# Patient Record
Sex: Female | Born: 2000 | Hispanic: No | Marital: Single
Health system: Western US, Academic
[De-identification: ages and names within clinical notes are randomized; demographics above are authoritative.]

## PROBLEM LIST (undated history)

## (undated) DIAGNOSIS — F419 Anxiety disorder, unspecified: Secondary | ICD-10-CM

## (undated) DIAGNOSIS — F329 Major depressive disorder, single episode, unspecified: Secondary | ICD-10-CM

## (undated) DIAGNOSIS — F32A Depression, unspecified: Secondary | ICD-10-CM

## (undated) DIAGNOSIS — N946 Dysmenorrhea, unspecified: Secondary | ICD-10-CM

## (undated) DIAGNOSIS — E876 Hypokalemia: Secondary | ICD-10-CM

## (undated) DIAGNOSIS — F909 Attention-deficit hyperactivity disorder, unspecified type: Secondary | ICD-10-CM

## (undated) DIAGNOSIS — N201 Calculus of ureter: Secondary | ICD-10-CM

## (undated) DIAGNOSIS — F309 Manic episode, unspecified: Secondary | ICD-10-CM

## (undated) DIAGNOSIS — N200 Calculus of kidney: Secondary | ICD-10-CM

## (undated) HISTORY — DX: Anxiety disorder, unspecified: F41.9

## (undated) HISTORY — DX: Depression, unspecified: F32.A

## (undated) HISTORY — DX: Calculus of ureter: N20.1

## (undated) HISTORY — DX: Dysmenorrhea, unspecified: N94.6

## (undated) HISTORY — DX: Hypokalemia: E87.6

## (undated) HISTORY — DX: Manic episode, unspecified: F30.9

## (undated) HISTORY — DX: Calculus of kidney: N20.0

---
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
## 2018-08-02 ENCOUNTER — Encounter (HOSPITAL_COMMUNITY): Payer: Self-pay | Admitting: *Deleted

## 2018-08-02 ENCOUNTER — Emergency Department (HOSPITAL_COMMUNITY)
Admission: EM | Admit: 2018-08-02 | Discharge: 2018-08-02 | Disposition: A | Discharge status: Home / Self Care | Attending: Emergency Medicine | Admitting: Emergency Medicine

## 2018-08-02 ENCOUNTER — Other Ambulatory Visit: Payer: Self-pay

## 2018-08-02 DIAGNOSIS — F909 Attention-deficit hyperactivity disorder, unspecified type: Secondary | ICD-10-CM | POA: Insufficient documentation

## 2018-08-02 DIAGNOSIS — Z79899 Other long term (current) drug therapy: Secondary | ICD-10-CM | POA: Diagnosis not present

## 2018-08-02 DIAGNOSIS — Z793 Long term (current) use of hormonal contraceptives: Secondary | ICD-10-CM | POA: Insufficient documentation

## 2018-08-02 DIAGNOSIS — N12 Tubulo-interstitial nephritis, not specified as acute or chronic: Secondary | ICD-10-CM

## 2018-08-02 DIAGNOSIS — N899 Noninflammatory disorder of vagina, unspecified: Secondary | ICD-10-CM | POA: Diagnosis not present

## 2018-08-02 DIAGNOSIS — N10 Acute tubulo-interstitial nephritis: Secondary | ICD-10-CM | POA: Insufficient documentation

## 2018-08-02 DIAGNOSIS — R1032 Left lower quadrant pain: Secondary | ICD-10-CM | POA: Diagnosis present

## 2018-08-02 DIAGNOSIS — R112 Nausea with vomiting, unspecified: Secondary | ICD-10-CM | POA: Insufficient documentation

## 2018-08-02 LAB — CBC WITH DIFFERENTIAL/PLATELET
Abs Immature Granulocytes: 0.02 10*3/uL (ref 0.00–0.07)
Basophils Absolute: 0 10*3/uL (ref 0.0–0.1)
Basophils Relative: 1 %
Eosinophils Absolute: 0.1 10*3/uL (ref 0.0–0.5)
Eosinophils Relative: 2 %
HCT: 41.4 % (ref 36.0–46.0)
Hemoglobin: 13.6 g/dL (ref 12.0–15.0)
Immature Granulocytes: 0 %
Lymphocytes Relative: 22 %
Lymphs Abs: 1.9 10*3/uL (ref 0.7–4.0)
MCH: 31.6 pg (ref 26.0–34.0)
MCHC: 32.9 g/dL (ref 30.0–36.0)
MCV: 96.1 fL (ref 80.0–100.0)
Monocytes Absolute: 0.6 10*3/uL (ref 0.1–1.0)
Monocytes Relative: 7 %
Neutro Abs: 5.7 10*3/uL (ref 1.7–7.7)
Neutrophils Relative %: 68 %
Platelets: 209 10*3/uL (ref 150–400)
RBC: 4.31 MIL/uL (ref 3.87–5.11)
RDW: 13.1 % (ref 11.5–15.5)
WBC: 8.4 10*3/uL (ref 4.0–10.5)
nRBC: 0 % (ref 0.0–0.2)

## 2018-08-02 LAB — URINALYSIS, ROUTINE W REFLEX MICROSCOPIC
Bilirubin Urine: NEGATIVE
Glucose, UA: NEGATIVE mg/dL
Ketones, ur: 5 mg/dL — AB
Nitrite: NEGATIVE
Protein, ur: 30 mg/dL — AB
Specific Gravity, Urine: 1.013 (ref 1.005–1.030)
WBC, UA: >50 WBC/hpf — ABNORMAL HIGH (ref 0–5)
pH: 6 (ref 5.0–8.0)

## 2018-08-02 LAB — BASIC METABOLIC PANEL
Anion gap: 10 (ref 5–15)
BUN: 21 mg/dL — ABNORMAL HIGH (ref 6–20)
CO2: 23 mmol/L (ref 22–32)
Calcium: 9.4 mg/dL (ref 8.9–10.3)
Chloride: 105 mmol/L (ref 98–111)
Creatinine, Ser: 0.86 mg/dL (ref 0.44–1.00)
GFR calc Af Amer: >60 mL/min (ref >60)
GFR calc non Af Amer: >60 mL/min (ref >60)
Glucose, Bld: 88 mg/dL (ref 70–99)
Potassium: 4.2 mmol/L (ref 3.5–5.1)
Sodium: 138 mmol/L (ref 135–145)

## 2018-08-02 LAB — WET PREP, GENITAL
Clue Cells Wet Prep HPF POC: NONE SEEN
Sperm: NONE SEEN
Trich, Wet Prep: NONE SEEN
Yeast Wet Prep HPF POC: NONE SEEN

## 2018-08-02 LAB — I-STAT BETA HCG BLOOD, ED (MC, WL, AP ONLY): I-stat hCG, quantitative: <5 m[IU]/mL (ref <5)

## 2018-08-02 MED ORDER — CEPHALEXIN 500 MG PO CAPS: 500.0000 mg | ORAL_CAPSULE | Freq: Four times a day (QID) | ORAL | 0 refills | Status: DC

## 2018-08-02 MED ORDER — SODIUM CHLORIDE 0.9 % IV SOLN
1.0000 g | Freq: Once | INTRAVENOUS | Status: AC
  Administered 2018-08-02: 1 g via INTRAVENOUS
  Filled 2018-08-02: qty 10

## 2018-08-02 NOTE — ED Triage Notes (Signed)
Pt in c/o L sided flank pain onset x 1 mth worse today, pt denies dysuria, pt denies n/v/d, pt denies vaginal bleeding, A&O x4

## 2018-08-02 NOTE — ED Notes (Signed)
Pelvic cart setup ready for pelvic

## 2018-08-02 NOTE — ED Provider Notes (Signed)
[facility]ERGENCY [facility]PART[facility] Provider Note   CSN: [id number] Arrival date & time: 08/02/18  [id number]     History   Chief Complaint Chief Complaint  Patient presents with  . Flank Pain    HPI Elaine Wong is a 18 y.o. female.     HPI  Patient is a 18 yo female with past medical history of ADHD, depression, anxiety, pyelonephritis presenting for left flank pain.  She reports that it is been intermittent for about a month but worsened over the past 24 hours.  It is associated with nausea, vomiting x1 today, nonbilious nonbloody.  Denies fever or chills.  She reports that she took an anti-inflammatory this morning which improved her symptoms.  She denies dysuria, urgency, frequency, or hematuria.  She does report some slight increase in vaginal discharge that is tinged with brown.  She is on oral contraceptives.  She reports she is in a monogamous sexual relationship with one female partner.  Denies concern about STI.  Denies chest pain or shortness of breath.  Past Medical History:  Diagnosis Date  . ADHD     There are no active problems to display for this patient.   History reviewed. No pertinent surgical history.   OB History   No obstetric history on file.      Home Medications    Prior to Admission medications   Medication Sig Start Date End Date Taking? Authorizing Provider  cephALEXin (KEFLEX) 500 MG capsule Take 1 capsule (500 mg total) by mouth 4 (four) times daily. 10/31/16   Muthersbaugh, Dahlia ClientHannah, PA-C  ibuprofen (ADVIL,MOTRIN) 600 MG tablet Take 1 tablet (600 mg total) by mouth every 6 (six) hours as needed. 05/20/17   Joy, Shawn C, PA-C  lidocaine (LIDODERM) 5 % Place 1 patch onto the skin daily. Remove & Discard patch within 12 hours or as directed by MD 05/20/17   Joy, Shawn C, PA-C  methocarbamol (ROBAXIN) 500 MG tablet Take 1 tablet (500 mg total) by mouth 2 (two) times daily. 05/20/17   Joy, Shawn C, PA-C  methylphenidate 54 MG PO CR tablet  Take 54 mg by mouth every morning.    [provider]    Family History No family history on file.  Social History Social History   Tobacco Use  . Smoking status: Never Smoker  . Smokeless tobacco: Never Used  Substance Use Topics  . Alcohol use: No  . Drug use: No     Allergies   Patient has no known allergies.   Review of Systems Review of Systems  Constitutional: Negative for chills and fever.  HENT: Negative for congestion and sore throat.   Eyes: Negative for visual disturbance.  Respiratory: Negative for cough, chest tightness and shortness of breath.   Cardiovascular: Negative for chest pain and leg swelling.  Gastrointestinal: Positive for nausea and vomiting. Negative for abdominal pain.  Genitourinary: Positive for flank pain. Negative for dysuria.  Musculoskeletal: Negative for back pain and myalgias.  Skin: Negative for rash.  Neurological: Negative for dizziness, syncope and light-headedness.     Physical Exam Updated Vital Signs BP (!) 109/59 (BP Location: Right Arm)   Pulse 87   Temp 98.5 F (36.9 C) (Oral)   Resp 18   Ht 5\' 6"  (1.676 m)   LMP 07/19/2018 (Approximate)   SpO2 100%   BMI 20.98 kg/m   Physical Exam Vitals signs and nursing note reviewed.  Constitutional:      General: She is not in  acute distress.    Appearance: She is well-developed.  HENT:     Head: Normocephalic and atraumatic.     Mouth/Throat:     Mouth: Mucous membranes are moist.  Eyes:     Conjunctiva/sclera: Conjunctivae normal.     Pupils: Pupils are equal, round, and reactive to light.  Neck:     Musculoskeletal: Normal range of motion and neck supple.  Cardiovascular:     Rate and Rhythm: Normal rate and regular rhythm.     Heart sounds: S1 normal and S2 normal. No murmur.  Pulmonary:     Effort: Pulmonary effort is normal.     Breath sounds: Normal breath sounds. No wheezing or rales.  Abdominal:     General: There is no distension.      Palpations: Abdomen is soft.     Tenderness: There is no abdominal tenderness. There is left CVA tenderness. There is no guarding.  Genitourinary:    Comments: Pelvic examination performed with nurse tech chaperone present.  No lymphadenopathy of the inguinal region.  Vaginal tissue pink and rugated.  Small amount of milky white discharge in vaginal vault.  Cervix nonerythematous and nonfriable.  No CMT nor adnexal tenderness. Musculoskeletal: Normal range of motion.        General: No deformity.  Lymphadenopathy:     Cervical: No cervical adenopathy.  Skin:    General: Skin is warm and dry.     Findings: No erythema or rash.  Neurological:     Mental Status: She is alert.     Comments: Cranial nerves grossly intact. Patient moves extremities symmetrically and with good coordination.  Psychiatric:        Behavior: Behavior normal.        Thought Content: Thought content normal.        Judgment: Judgment normal.      ED Treatments / Results  Labs (all labs ordered are listed, but only abnormal results are displayed) Labs Reviewed  WET PREP, GENITAL - Abnormal; Notable for the following components:      Result Value   WBC, Wet Prep HPF POC MANY (*)    All other components within normal limits  URINALYSIS, ROUTINE W REFLEX MICROSCOPIC - Abnormal; Notable for the following components:   APPearance HAZY (*)    Hgb urine dipstick MODERATE (*)    Ketones, ur 5 (*)    Protein, ur 30 (*)    Leukocytes,Ua LARGE (*)    WBC, UA >50 (*)    Bacteria, UA FEW (*)    All other components within normal limits  BASIC METABOLIC PANEL - Abnormal; Notable for the following components:   BUN 21 (*)    All other components within normal limits  URINE CULTURE  CBC WITH DIFFERENTIAL/PLATELET  HIV ANTIBODY (ROUTINE TESTING W REFLEX)  RPR  I-STAT BETA HCG BLOOD, ED ([facility], [facility], [facility] ONLY)  GC/CHLAMYDIA PROBE AMP ([place]) NOT AT [facility]    EKG None  Radiology No results found.  Procedures  Procedures (including critical care time)  Medications Ordered in ED Medications - No data to display   Initial Impression / Assessment and Plan / ED Course  I have reviewed the triage vital signs and the nursing notes.  Pertinent labs & imaging results that were available during my care of the patient were reviewed by me and considered in my medical decision making (see chart for details).  Clinical Course as of Aug 01 1201  Thu Aug 02, 2018  1132  Suggestive of pyelo.  Urinalysis, Routine w reflex microscopic- may I&O cath if menses(!) [AM]  1133 Consistent with pt's report of vaginal spotting. Presentation not consistent with nephrolithiasis.  Hgb urine dipstickMarland Kitchen): MODERATE [AM]  1203 Clinically, not suggestive of PID on exam.   WBC, Wet Prep HPF POC(!): MANY [AM]    Clinical Course User Index [AM] Albesa Seen, PA-C       This is a well-appearing 18 year old female with past medical history of ADHD, depression, anxiety, pyelonephritis presenting for left flank pain.  Intermittent x1 month but reports worsening over the past 24 hours.  She does have systemic symptoms of nausea and vomiting.  No fever or chills.  Urinalysis suggestive of infection.  She has minimal microscopic WBCs, and history not consistent with nephrolithiasis. Not suggestive of PE. Clinically, more suggestive of pyelonephritis which patient has a history of.  No leukocytosis.  Normal renal function.  Patient given Rocephin x1 here in the emergency department and will prescribe Keflex.  She is given return precautions for any worsening pain, tractable nausea or vomiting or fevers.  Patient is in understanding and agrees with plan of care.  Final Clinical Impressions(s) / ED Diagnoses   Final diagnoses:  Pyelonephritis    ED Discharge Orders         Ordered    cephALEXin (KEFLEX) 500 MG capsule  4 times daily     08/02/18 [address] 08/02/18 1216    Malvin Johns, MD  08/02/18 1244

## 2018-08-02 NOTE — Discharge Instructions (Signed)
Please see the information and instructions below regarding your visit.  Your diagnoses today include:  1. Pyelonephritis    Your symptoms are caused by a urinary tract infection, which occurs when bacteria travel up into the bladder. From there, it can ascend to the kidney. This is a very common condition. Often, bacteria is transmitted while wiping after using the restroom. It is reassuring that you do not have a fever today.  Tests performed today include: See side panel of your discharge paperwork for testing performed today. Vital signs are listed at the bottom of these instructions.  -Urine tests. Suggestive of infection.   Your wet prep does not demonstrate any bacterial vaginosis or yeast infection.  Medications prescribed:    Take any prescribed medications only as prescribed, and any over the counter medications only as directed on the packaging.  Please take all of your antibiotics until finished.   You may develop abdominal discomfort or nausea from the antibiotic. If this occurs, you may take it with food. Some patients also get diarrhea with antibiotics. You may help offset this with probiotics which you can buy or get in yogurt. Do not eat or take the probiotics until 2 hours after your antibiotic. Some women develop vaginal yeast infections after antibiotics. If you develop unusual vaginal discharge after being on this medication, please see your primary care provider.   Some people develop allergies to antibiotics. Symptoms of antibiotic allergy can be mild and include a flat rash and itching. They can also be more serious and include:  ?Hives - Hives are raised, red patches of skin that are usually very itchy.  ?Lip or tongue swelling  ?Trouble swallowing or breathing  ?Blistering of the skin or mouth.  If you have any of these serious symptoms, please seek emergency medical care immediately.   Home care instructions:  Please follow any educational materials  contained in this packet.  Please stay hydrated by making sure that your urine is very light yellow in color.    Follow-up instructions: Please follow-up with your primary care provider in one week for further evaluation of your symptoms.   Return instructions:  Please return to the Emergency Department if you experience worsening symptoms. Please seek immediate care if you develop the following: Your symptoms are no better or worse in 3 days. There is worsening back pain or lower abdominal pain.  You develop chills.  You have a fever.  There is nausea or vomiting.  There is continued burning or discomfort with urination.  You have any additional concerns.  Please return if you have any other emergent concerns.  Additional Information:   Your vital signs today were: BP 112/82 (BP Location: Left Arm)    Pulse 96    Temp 98.5 F (36.9 C) (Oral)    Resp 16    Ht 5\' 6"  (1.676 m)    LMP 07/19/2018 (Approximate)    SpO2 99%    BMI 20.98 kg/m  If your blood pressure (BP) was elevated on multiple readings during this visit above 130 for the top number or above 80 for the bottom number, please have this repeated by your primary care provider within one month. --------------  Thank you for allowing [name] to participate in your care today.

## 2018-08-03 LAB — HIV ANTIBODY (ROUTINE TESTING W REFLEX): HIV Screen 4th Generation wRfx: NONREACTIVE

## 2018-08-03 LAB — GC/CHLAMYDIA PROBE AMP (~~LOC~~) NOT AT ARMC
Chlamydia: NEGATIVE
Neisseria Gonorrhea: NEGATIVE

## 2018-08-04 LAB — URINE CULTURE: Culture: 90000 — AB

## 2018-08-05 ENCOUNTER — Telehealth: Payer: Self-pay | Admitting: Emergency Medicine

## 2018-08-05 NOTE — Telephone Encounter (Signed)
Post ED Visit - Positive Culture Follow-up  Culture report reviewed by antimicrobial stewardship pharmacist: [place] Team []  Elenor Quinones, Pharm.D. []  Heide Guile, Pharm.D., BCPS AQ-ID []  Parks Neptune, Pharm.D., BCPS []  Alycia Rossetti, Pharm.D., BCPS []  [place], Pharm.D., BCPS, AAHIVP []  Legrand Como, Pharm.D., BCPS, AAHIVP []  Salome Arnt, PharmD, BCPS []  Johnnette Gourd, PharmD, BCPS [x]  Hughes Better, PharmD, BCPS []  Leeroy Cha, PharmD []  Laqueta Linden, PharmD, BCPS []  Albertina Parr, PharmD  [place] Team []  Leodis Sias, PharmD []  Lindell Spar, PharmD []  Royetta Asal, PharmD []  Graylin Shiver, Rph []  Rema Fendt) Glennon Mac, PharmD []  Arlyn Dunning, PharmD []  Netta Cedars, PharmD []  Dia Sitter, PharmD []  Leone Haven, PharmD []  Gretta Arab, PharmD []  Theodis Shove, PharmD []  Peggyann Juba, PharmD []  Reuel Boom, PharmD   Positive urine culture Treated with Cephalexin, organism sensitive to the same and no further patient follow-up is required at this time.  Sandi Raveling Elaine Wong 08/05/2018, 1:56 PM

## 2018-08-18 ENCOUNTER — Emergency Department (HOSPITAL_COMMUNITY): Admitting: Anesthesiology

## 2018-08-18 ENCOUNTER — Emergency Department (HOSPITAL_COMMUNITY)

## 2018-08-18 ENCOUNTER — Other Ambulatory Visit: Payer: Self-pay

## 2018-08-18 ENCOUNTER — Inpatient Hospital Stay (HOSPITAL_COMMUNITY)
Admission: EM | Admit: 2018-08-18 | Discharge: 2018-08-20 | DRG: 661 | Disposition: A | Discharge status: Home / Self Care | Attending: Urology | Admitting: Urology

## 2018-08-18 ENCOUNTER — Encounter (HOSPITAL_COMMUNITY): Payer: Self-pay | Admitting: *Deleted

## 2018-08-18 ENCOUNTER — Encounter (HOSPITAL_COMMUNITY)
Admission: EM | Disposition: A | Discharge status: Home / Self Care | Payer: Self-pay | Source: Home / Self Care | Attending: Urology

## 2018-08-18 DIAGNOSIS — N136 Pyonephrosis: Principal | ICD-10-CM | POA: Diagnosis present

## 2018-08-18 DIAGNOSIS — B9620 Unspecified Escherichia coli [E. coli] as the cause of diseases classified elsewhere: Secondary | ICD-10-CM | POA: Diagnosis present

## 2018-08-18 DIAGNOSIS — N179 Acute kidney failure, unspecified: Secondary | ICD-10-CM | POA: Diagnosis present

## 2018-08-18 DIAGNOSIS — D72829 Elevated white blood cell count, unspecified: Secondary | ICD-10-CM

## 2018-08-18 DIAGNOSIS — N200 Calculus of kidney: Secondary | ICD-10-CM

## 2018-08-18 DIAGNOSIS — Z20828 Contact with and (suspected) exposure to other viral communicable diseases: Secondary | ICD-10-CM | POA: Diagnosis present

## 2018-08-18 DIAGNOSIS — E876 Hypokalemia: Secondary | ICD-10-CM | POA: Diagnosis present

## 2018-08-18 DIAGNOSIS — F909 Attention-deficit hyperactivity disorder, unspecified type: Secondary | ICD-10-CM | POA: Diagnosis present

## 2018-08-18 DIAGNOSIS — R739 Hyperglycemia, unspecified: Secondary | ICD-10-CM

## 2018-08-18 DIAGNOSIS — N132 Hydronephrosis with renal and ureteral calculous obstruction: Secondary | ICD-10-CM | POA: Diagnosis present

## 2018-08-18 DIAGNOSIS — N201 Calculus of ureter: Secondary | ICD-10-CM

## 2018-08-18 DIAGNOSIS — N12 Tubulo-interstitial nephritis, not specified as acute or chronic: Secondary | ICD-10-CM

## 2018-08-18 HISTORY — PX: HOLMIUM LASER APPLICATION: SHX5852

## 2018-08-18 HISTORY — PX: CYSTOSCOPY WITH RETROGRADE PYELOGRAM, URETEROSCOPY AND STENT PLACEMENT: SHX5789

## 2018-08-18 LAB — URINALYSIS, ROUTINE W REFLEX MICROSCOPIC
Bilirubin Urine: NEGATIVE
Glucose, UA: NEGATIVE mg/dL
Ketones, ur: NEGATIVE mg/dL
Nitrite: NEGATIVE
Protein, ur: 30 mg/dL — AB
Specific Gravity, Urine: 1.02 (ref 1.005–1.030)
WBC, UA: >50 WBC/hpf — ABNORMAL HIGH (ref 0–5)
pH: 7 (ref 5.0–8.0)

## 2018-08-18 LAB — COMPREHENSIVE METABOLIC PANEL
ALT: 17 U/L (ref 0–44)
AST: 24 U/L (ref 15–41)
Albumin: 4.4 g/dL (ref 3.5–5.0)
Alkaline Phosphatase: 35 U/L — ABNORMAL LOW (ref 38–126)
Anion gap: 13 (ref 5–15)
BUN: 26 mg/dL — ABNORMAL HIGH (ref 6–20)
CO2: 20 mmol/L — ABNORMAL LOW (ref 22–32)
Calcium: 10.1 mg/dL (ref 8.9–10.3)
Chloride: 105 mmol/L (ref 98–111)
Creatinine, Ser: 1.19 mg/dL — ABNORMAL HIGH (ref 0.44–1.00)
GFR calc Af Amer: >60 mL/min (ref >60)
GFR calc non Af Amer: >60 mL/min (ref >60)
Glucose, Bld: 136 mg/dL — ABNORMAL HIGH (ref 70–99)
Potassium: 3 mmol/L — ABNORMAL LOW (ref 3.5–5.1)
Sodium: 138 mmol/L (ref 135–145)
Total Bilirubin: 0.7 mg/dL (ref 0.3–1.2)
Total Protein: 7.5 g/dL (ref 6.5–8.1)

## 2018-08-18 LAB — CBC
HCT: 42.8 % (ref 36.0–46.0)
Hemoglobin: 14.8 g/dL (ref 12.0–15.0)
MCH: 31.7 pg (ref 26.0–34.0)
MCHC: 34.6 g/dL (ref 30.0–36.0)
MCV: 91.6 fL (ref 80.0–100.0)
Platelets: 317 10*3/uL (ref 150–400)
RBC: 4.67 MIL/uL (ref 3.87–5.11)
RDW: 12.7 % (ref 11.5–15.5)
WBC: 17.5 10*3/uL — ABNORMAL HIGH (ref 4.0–10.5)
nRBC: 0 % (ref 0.0–0.2)

## 2018-08-18 LAB — SARS CORONAVIRUS 2 BY RT PCR (HOSPITAL ORDER, PERFORMED IN ~~LOC~~ HOSPITAL LAB): SARS Coronavirus 2: NEGATIVE

## 2018-08-18 LAB — I-STAT BETA HCG BLOOD, ED (MC, WL, AP ONLY): I-stat hCG, quantitative: <5 m[IU]/mL (ref <5)

## 2018-08-18 LAB — PREGNANCY, URINE: Preg Test, Ur: NEGATIVE

## 2018-08-18 SURGERY — CYSTOURETEROSCOPY, WITH RETROGRADE PYELOGRAM AND STENT INSERTION
Anesthesia: General | Site: Ureter | Laterality: Left

## 2018-08-18 MED ORDER — IOHEXOL 300 MG/ML  SOLN
80.0000 mL | Freq: Once | INTRAMUSCULAR | Status: AC | PRN
  Administered 2018-08-18: 80 mL via INTRAVENOUS

## 2018-08-18 MED ORDER — KETOROLAC TROMETHAMINE 30 MG/ML IJ SOLN
30.0000 mg | Freq: Once | INTRAMUSCULAR | Status: AC
  Administered 2018-08-18: 30 mg via INTRAVENOUS
  Filled 2018-08-18: qty 1

## 2018-08-18 MED ORDER — PHENYLEPHRINE 40 MCG/ML (10ML) SYRINGE FOR IV PUSH (FOR BLOOD PRESSURE SUPPORT)
PREFILLED_SYRINGE | INTRAVENOUS | Status: DC | PRN
  Administered 2018-08-18 (×2): 120 ug via INTRAVENOUS
  Administered 2018-08-18 (×3): 80 ug via INTRAVENOUS

## 2018-08-18 MED ORDER — FENTANYL CITRATE (PF) 100 MCG/2ML IJ SOLN
INTRAMUSCULAR | Status: DC | PRN
  Administered 2018-08-18 (×2): 50 ug via INTRAVENOUS

## 2018-08-18 MED ORDER — ROCURONIUM BROMIDE 10 MG/ML (PF) SYRINGE
PREFILLED_SYRINGE | INTRAVENOUS | Status: AC
  Filled 2018-08-18: qty 10

## 2018-08-18 MED ORDER — PHENYLEPHRINE 40 MCG/ML (10ML) SYRINGE FOR IV PUSH (FOR BLOOD PRESSURE SUPPORT)
PREFILLED_SYRINGE | INTRAVENOUS | Status: AC
  Filled 2018-08-18: qty 10

## 2018-08-18 MED ORDER — PROPOFOL 10 MG/ML IV BOLUS
INTRAVENOUS | Status: AC
  Filled 2018-08-18: qty 40

## 2018-08-18 MED ORDER — LIDOCAINE 2% (20 MG/ML) 5 ML SYRINGE
INTRAMUSCULAR | Status: AC
  Filled 2018-08-18: qty 5

## 2018-08-18 MED ORDER — SODIUM CHLORIDE 0.9 % IV BOLUS
1000.0000 mL | Freq: Once | INTRAVENOUS | Status: AC
  Administered 2018-08-18: 12:00:00 1000 mL via INTRAVENOUS

## 2018-08-18 MED ORDER — PROPOFOL 10 MG/ML IV BOLUS
INTRAVENOUS | Status: DC | PRN
  Administered 2018-08-18: 160 mg via INTRAVENOUS

## 2018-08-18 MED ORDER — FENTANYL CITRATE (PF) 100 MCG/2ML IJ SOLN
INTRAMUSCULAR | Status: AC
  Filled 2018-08-18: qty 2

## 2018-08-18 MED ORDER — MIDAZOLAM HCL 2 MG/2ML IJ SOLN
INTRAMUSCULAR | Status: AC
  Filled 2018-08-18: qty 2

## 2018-08-18 MED ORDER — SENNOSIDES-DOCUSATE SODIUM 8.6-50 MG PO TABS
1.0000 | ORAL_TABLET | Freq: Two times a day (BID) | ORAL | Status: DC
  Administered 2018-08-18 – 2018-08-20 (×4): 1 via ORAL
  Filled 2018-08-18 (×4): qty 1

## 2018-08-18 MED ORDER — FENTANYL CITRATE (PF) 100 MCG/2ML IJ SOLN
50.0000 ug | INTRAMUSCULAR | Status: AC | PRN
  Administered 2018-08-18 (×2): 50 ug via INTRAVENOUS
  Filled 2018-08-18 (×2): qty 2

## 2018-08-18 MED ORDER — NORETHIN ACE-ETH ESTRAD-FE 1-20 MG-MCG PO TABS: 1.0000 | ORAL_TABLET | Freq: Every day | ORAL | Status: DC

## 2018-08-18 MED ORDER — OXYCODONE HCL 5 MG PO TABS: 5.0000 mg | ORAL_TABLET | ORAL | Status: DC | PRN

## 2018-08-18 MED ORDER — MIDAZOLAM HCL 5 MG/5ML IJ SOLN
INTRAMUSCULAR | Status: DC | PRN
  Administered 2018-08-18: 2 mg via INTRAVENOUS

## 2018-08-18 MED ORDER — ACETAMINOPHEN 500 MG PO TABS
1000.0000 mg | ORAL_TABLET | Freq: Three times a day (TID) | ORAL | Status: AC
  Administered 2018-08-18 – 2018-08-19 (×3): 1000 mg via ORAL
  Filled 2018-08-18 (×4): qty 2

## 2018-08-18 MED ORDER — HYDROMORPHONE HCL 1 MG/ML IJ SOLN: 0.5000 mg | INTRAMUSCULAR | Status: DC | PRN

## 2018-08-18 MED ORDER — LACTATED RINGERS IV SOLN
INTRAVENOUS | Status: DC | PRN
  Administered 2018-08-18: 17:00:00 via INTRAVENOUS

## 2018-08-18 MED ORDER — MEPERIDINE HCL 50 MG/ML IJ SOLN: 6.2500 mg | INTRAMUSCULAR | Status: DC | PRN

## 2018-08-18 MED ORDER — LIDOCAINE 2% (20 MG/ML) 5 ML SYRINGE
INTRAMUSCULAR | Status: DC | PRN
  Administered 2018-08-18: 60 mg via INTRAVENOUS

## 2018-08-18 MED ORDER — METOCLOPRAMIDE HCL 5 MG/ML IJ SOLN: 10.0000 mg | Freq: Once | INTRAMUSCULAR | Status: DC | PRN

## 2018-08-18 MED ORDER — SODIUM CHLORIDE 0.9 % IV SOLN
1.0000 g | Freq: Once | INTRAVENOUS | Status: AC
  Administered 2018-08-18: 1 g via INTRAVENOUS
  Filled 2018-08-18: qty 10

## 2018-08-18 MED ORDER — SODIUM CHLORIDE 0.9 % IV SOLN
1.0000 g | INTRAVENOUS | Status: DC
  Administered 2018-08-19 – 2018-08-20 (×2): 1 g via INTRAVENOUS
  Filled 2018-08-18: qty 10
  Filled 2018-08-18: qty 1

## 2018-08-18 MED ORDER — DEXAMETHASONE SODIUM PHOSPHATE 10 MG/ML IJ SOLN
INTRAMUSCULAR | Status: DC | PRN
  Administered 2018-08-18: 5 mg via INTRAVENOUS

## 2018-08-18 MED ORDER — SODIUM CHLORIDE 0.9% FLUSH: 3.0000 mL | Freq: Once | INTRAVENOUS | Status: DC

## 2018-08-18 MED ORDER — ONDANSETRON HCL 4 MG/2ML IJ SOLN
4.0000 mg | Freq: Once | INTRAMUSCULAR | Status: AC
  Administered 2018-08-18: 4 mg via INTRAVENOUS
  Filled 2018-08-18: qty 2

## 2018-08-18 MED ORDER — FENTANYL CITRATE (PF) 100 MCG/2ML IJ SOLN: 25.0000 ug | INTRAMUSCULAR | Status: DC | PRN

## 2018-08-18 MED ORDER — ONDANSETRON HCL 4 MG/2ML IJ SOLN
INTRAMUSCULAR | Status: DC | PRN
  Administered 2018-08-18: 4 mg via INTRAVENOUS

## 2018-08-18 MED ORDER — SUCCINYLCHOLINE CHLORIDE 200 MG/10ML IV SOSY
PREFILLED_SYRINGE | INTRAVENOUS | Status: AC
  Filled 2018-08-18: qty 10

## 2018-08-18 MED ORDER — SODIUM CHLORIDE 0.9 % IR SOLN
Status: DC | PRN
  Administered 2018-08-18: 3000 mL

## 2018-08-18 MED ORDER — LACTATED RINGERS IV SOLN: INTRAVENOUS | Status: DC

## 2018-08-18 MED ORDER — SERTRALINE HCL 25 MG PO TABS
25.0000 mg | ORAL_TABLET | Freq: Every day | ORAL | Status: DC
  Administered 2018-08-18 – 2018-08-20 (×3): 25 mg via ORAL
  Filled 2018-08-18 (×3): qty 1

## 2018-08-18 MED ORDER — IOHEXOL 300 MG/ML  SOLN
INTRAMUSCULAR | Status: DC | PRN
  Administered 2018-08-18: 10 mL

## 2018-08-18 SURGICAL SUPPLY — 23 items
BAG URO CATCHER STRL LF (MISCELLANEOUS) ×2 IMPLANT
BASKET LASER NITINOL 1.9FR (BASKET) ×2 IMPLANT
CATH INTERMIT  6FR 70CM (CATHETERS) ×2 IMPLANT
CLOTH BEACON ORANGE TIMEOUT ST (SAFETY) ×2 IMPLANT
COVER SURGICAL LIGHT HANDLE (MISCELLANEOUS) ×2 IMPLANT
COVER WAND RF STERILE (DRAPES) IMPLANT
EXTRACTOR STONE 1.7FRX115CM (UROLOGICAL SUPPLIES) IMPLANT
FIBER LASER FLEXIVA 1000 (UROLOGICAL SUPPLIES) IMPLANT
FIBER LASER FLEXIVA 365 (UROLOGICAL SUPPLIES) IMPLANT
FIBER LASER FLEXIVA 550 (UROLOGICAL SUPPLIES) IMPLANT
FIBER LASER TRAC TIP (UROLOGICAL SUPPLIES) ×2 IMPLANT
GLOVE BIOGEL M STRL SZ7.5 (GLOVE) ×2 IMPLANT
GOWN STRL REUS W/TWL LRG LVL3 (GOWN DISPOSABLE) ×2 IMPLANT
GUIDEWIRE ANG ZIPWIRE 038X150 (WIRE) ×2 IMPLANT
GUIDEWIRE STR DUAL SENSOR (WIRE) ×2 IMPLANT
KIT TURNOVER KIT A (KITS) IMPLANT
MANIFOLD NEPTUNE II (INSTRUMENTS) ×2 IMPLANT
PACK CYSTO (CUSTOM PROCEDURE TRAY) ×2 IMPLANT
SHEATH URETERAL 12FRX28CM (UROLOGICAL SUPPLIES) IMPLANT
SHEATH URETERAL 12FRX35CM (MISCELLANEOUS) IMPLANT
STENT POLARIS 5FRX24 (STENTS) ×2 IMPLANT
TUBE FEEDING 8FR 16IN STR KANG (MISCELLANEOUS) ×2 IMPLANT
TUBING CONNECTING 10 (TUBING) ×2 IMPLANT

## 2018-08-18 NOTE — Anesthesia Preprocedure Evaluation (Signed)
Anesthesia Evaluation  Patient identified by MRN, date of birth, ID band Patient awake    Reviewed: Allergy & Precautions, NPO status , Patient's Chart, lab work & pertinent test results  Airway Mallampati: II  TM Distance: >3 FB Neck ROM: Full    Dental no notable dental hx.    Pulmonary neg pulmonary ROS,    Pulmonary exam normal breath sounds clear to auscultation       Cardiovascular negative cardio ROS Normal cardiovascular exam Rhythm:Regular Rate:Normal     Neuro/Psych negative neurological ROS  negative psych ROS   GI/Hepatic negative GI ROS, Neg liver ROS,   Endo/Other  negative endocrine ROS  Renal/GU negative Renal ROS  negative genitourinary   Musculoskeletal negative musculoskeletal ROS (+)   Abdominal   Peds negative pediatric ROS (+)  Hematology negative hematology ROS (+)   Anesthesia Other Findings   Reproductive/Obstetrics negative OB ROS                             Anesthesia Physical Anesthesia Plan  ASA: II  Anesthesia Plan: General   Post-op Pain Management:    Induction: Intravenous  PONV Risk Score and Plan: 4 or greater and Ondansetron, Dexamethasone, Treatment may vary due to age or medical condition and Midazolam  Airway Management Planned: LMA and Oral ETT  Additional Equipment:   Intra-op Plan:   Post-operative Plan: Extubation in OR  Informed Consent: I have reviewed the patients History and Physical, chart, labs and discussed the procedure including the risks, benefits and alternatives for the proposed anesthesia with the patient or authorized representative who has indicated his/her understanding and acceptance.     Dental advisory given  Plan Discussed with: CRNA  Anesthesia Plan Comments:         Anesthesia Quick Evaluation

## 2018-08-18 NOTE — Anesthesia Postprocedure Evaluation (Signed)
Anesthesia Post Note  Patient: Elaine Wong  Procedure(s) Performed: CYSTOSCOPY WITH RETROGRADE PYELOGRAM, URETEROSCOPY AND STENT PLACEMENT (Left Ureter) HOLMIUM LASER APPLICATION (Left Ureter)     Patient location during evaluation: PACU Anesthesia Type: General Level of consciousness: awake and alert Pain management: pain level controlled Vital Signs Assessment: post-procedure vital signs reviewed and stable Respiratory status: spontaneous breathing, nonlabored ventilation, respiratory function stable and patient connected to nasal cannula oxygen Cardiovascular status: blood pressure returned to baseline and stable Postop Assessment: no apparent nausea or vomiting Anesthetic complications: no    Last Vitals:  Vitals:   08/18/18 1815 08/18/18 1830  BP: 103/65 99/62  Pulse: 94 93  Resp: (!) 24 (!) 25  Temp:    SpO2: 98% 98%    Last Pain:  Vitals:   08/18/18 1815  TempSrc:   PainSc: 0-No pain                 Montez Hageman

## 2018-08-18 NOTE — Transfer of Care (Signed)
Immediate Anesthesia Transfer of Care Note  Patient: Elaine Wong  Procedure(s) Performed: CYSTOSCOPY WITH RETROGRADE PYELOGRAM, URETEROSCOPY AND STENT PLACEMENT (Left Ureter) HOLMIUM LASER APPLICATION (Left Ureter)  Patient Location: PACU  Anesthesia Type:General  Level of Consciousness: awake, alert  and oriented  Airway & Oxygen Therapy: Patient Spontanous Breathing and Patient connected to face mask oxygen  Post-op Assessment: Report given to RN and Post -op Vital signs reviewed and stable  Post vital signs: Reviewed and stable  Last Vitals:  Vitals Value Taken Time  BP 107/67 08/18/18 1738  Temp    Pulse 102 08/18/18 1739  Resp 22 08/18/18 1739  SpO2 100 % 08/18/18 1739  Vitals shown include unvalidated device data.  Last Pain:  Vitals:   08/18/18 1452  TempSrc:   PainSc: 0-No pain         Complications: No apparent anesthesia complications

## 2018-08-18 NOTE — ED Notes (Signed)
[facility] called @ 1338-per [place], PA called by Kwana Ringel-Transport to [facility].

## 2018-08-18 NOTE — ED Notes (Addendum)
[facility]erson Fuel [phone] call when pt is back to room. Stated relative who brought pt in would not be back.

## 2018-08-18 NOTE — Anesthesia Procedure Notes (Signed)
Procedure Name: LMA Insertion Performed by: Loann Chahal J, CRNA Pre-anesthesia Checklist: Patient identified, Emergency Drugs available, Suction available, Patient being monitored and Timeout performed Patient Re-evaluated:Patient Re-evaluated prior to induction Oxygen Delivery Method: Circle system utilized Preoxygenation: Pre-oxygenation with 100% oxygen Induction Type: IV induction Ventilation: Mask ventilation without difficulty LMA: LMA inserted LMA Size: 4.0 Number of attempts: 1 Placement Confirmation: positive ETCO2 and breath sounds checked- equal and bilateral Tube secured with: Tape Dental Injury: Teeth and Oropharynx as per pre-operative assessment        

## 2018-08-18 NOTE — H&P (Signed)
Elaine Wong is an 18 y.o. female.    Chief Complaint: Left Distal Ureteral Stone, Bacteruria   HPI:   1 - Left Distal Ureteral Stone - 23m left very distal stone by ER CT 08/2018 on eval persistant irritative voiding and left flank pain. Stone is solitary, SSD 10cm, 550 HU. Had had similar symptoms for 2+weeks.  2 -  Bacteruria - pan-sensitive e. Coli bacteruria 07/2018 and treated with ceph course.  UA 08/2018 with scant bacteruria, no fevers, WBC 17k (mild elevation).   PMH sig for anxiety/ADHD. No prior surgeries. No CV disease / blood thinners.   Today " Elaine Wong" is seen for evaluation and management for left distal ureteral stone in setting of recent bacteruria. UPreg Negative, Last Meal >12 hours ago.   Past Medical History:  Diagnosis Date  . ADHD     History reviewed. No pertinent surgical history.  No family history on file. Social History:  reports that she has never smoked. She has never used smokeless tobacco. She reports that she does not drink alcohol or use drugs.  Allergies: No Known Allergies  (Not in a hospital admission)   Results for orders placed or performed during the hospital encounter of 08/18/18 (from the past 48 hour(s))  Urinalysis, Routine w reflex microscopic     Status: Abnormal   Collection Time: 08/18/18  1:47 AM  Result Value Ref Range   Color, Urine YELLOW YELLOW   APPearance CLOUDY (A) CLEAR   Specific Gravity, Urine 1.020 1.005 - 1.030   pH 7.0 5.0 - 8.0   Glucose, UA NEGATIVE NEGATIVE mg/dL   Hgb urine dipstick SMALL (A) NEGATIVE   Bilirubin Urine NEGATIVE NEGATIVE   Ketones, ur NEGATIVE NEGATIVE mg/dL   Protein, ur 30 (A) NEGATIVE mg/dL   Nitrite NEGATIVE NEGATIVE   Leukocytes,Ua LARGE (A) NEGATIVE   RBC / HPF 11-20 0 - 5 RBC/hpf   WBC, UA >50 (H) 0 - 5 WBC/hpf   Bacteria, UA RARE (A) NONE SEEN   Squamous Epithelial / LPF 0-5 0 - 5   WBC Clumps PRESENT    Mucus PRESENT     Comment: Performed at [facility] [address] [place] [address] Pregnancy, urine     Status: None   Collection Time: 08/18/18  1:47 AM  Result Value Ref Range   Preg Test, Ur NEGATIVE NEGATIVE    Comment: Performed at [address] [place] [id number] Comprehensive metabolic panel     Status: Abnormal   Collection Time: 08/18/18  1:51 AM  Result Value Ref Range   Sodium 138 135 - 145 mmol/L   Potassium 3.0 (L) 3.5 - 5.1 mmol/L   Chloride 105 98 - 111 mmol/L   CO2 20 (L) 22 - 32 mmol/L   Glucose, Bld 136 (H) 70 - 99 mg/dL   BUN 26 (H) 6 - 20 mg/dL   Creatinine, Ser 1.19 (H) 0.44 - 1.00 mg/dL   Calcium 10.1 8.9 - 10.3 mg/dL   Total Protein 7.5 6.5 - 8.1 g/dL   Albumin 4.4 3.5 - 5.0 g/dL   AST 24 15 - 41 U/L   ALT 17 0 - 44 U/L   Alkaline Phosphatase 35 (L) 38 - 126 U/L   Total Bilirubin 0.7 0.3 - 1.2 mg/dL   GFR calc non Af Amer >60 >60 mL/min   GFR calc Af Amer >60 >60 mL/min   Anion gap 13 5 -  15    Comment: Performed at [facility], [place] [address] [place], [place] [address]  CBC     Status: Abnormal   Collection Time: 08/18/18  1:51 AM  Result Value Ref Range   WBC 17.5 (H) 4.0 - 10.5 K/uL   RBC 4.67 3.87 - 5.11 MIL/uL   Hemoglobin 14.8 12.0 - 15.0 g/dL   HCT 42.8 36.0 - 46.0 %   MCV 91.6 80.0 - 100.0 fL   MCH 31.7 26.0 - 34.0 pg   MCHC 34.6 30.0 - 36.0 g/dL   RDW 12.7 11.5 - 15.5 %   Platelets 317 150 - 400 K/uL   nRBC 0.0 0.0 - 0.2 %    Comment: Performed at [facility], [place] [address] [place], [place] [address]  I-Stat beta hCG blood, ED ([facility], [facility], [facility] only)     Status: None   Collection Time: 08/18/18  1:55 AM  Result Value Ref Range   I-stat hCG, quantitative <5.0 <5 mIU/mL   Comment 3            Comment:   GEST. AGE      CONC.  (mIU/mL)   <=1 WEEK        5 - 50     2 WEEKS       50 - 500     3 WEEKS       100 - 10,000     4 WEEKS     1,000 - 30,000        FEMALE AND NON-PREGNANT FEMALE:     LESS THAN 5 mIU/mL    Ct Abdomen Pelvis W Contrast  Result  Date: 08/18/2018 CLINICAL DATA:  LEFT flank pain.  Pyelonephritis. EXAM: CT ABDOMEN AND PELVIS WITH CONTRAST TECHNIQUE: Multidetector CT imaging of the abdomen and pelvis was performed using the standard protocol following bolus administration of intravenous contrast. CONTRAST:  8m OMNIPAQUE IOHEXOL 300 MG/ML  SOLN COMPARISON:  None. FINDINGS: Lower chest: Lung bases are clear. Hepatobiliary: No focal hepatic lesion. No biliary duct dilatation. Gallbladder is normal. Common bile duct is normal. Pancreas: Pancreas is normal. No ductal dilatation. No pancreatic inflammation. Spleen: Normal spleen Adrenals/urinary tract: Adrenal glands are normal. There is mild hypoperfusion of the LEFT kidney compared to the RIGHT kidney. Mild renal edema on the LEFT. Mild delayed kidney active cysts and hydroureter on the LEFT. There is a calculus in the deep LEFT pelvis in the vicinity of the LEFT vesicoureteral junction measuring 3 mm (image 71/3.) RIGHT kidney is normal.  RIGHT ureter normal.  No bladder calculi. Stomach/Bowel: Stomach, small-bowel and cecum are normal. The appendix is not identified but there is no pericecal inflammation to suggest appendicitis. The colon and rectosigmoid colon are normal. Vascular/Lymphatic: Abdominal aorta is normal caliber. No periportal or retroperitoneal adenopathy. No pelvic adenopathy. Reproductive: Ovaries and uterus normal. Other: No free fluid. Musculoskeletal: No aggressive osseous lesion. IMPRESSION: 1. Mild renal edema on the LEFT kidney with mild hydroureter. Concern for obstructing calculus at the LEFT vesicoureteral junction. 2. Cannot exclude superimposed infection of the LEFT kidney. 3. RIGHT kidney normal. Electronically Signed   By: SSuzy BouchardM.D.   On: 08/18/2018 12:59    Review of Systems  Constitutional: Negative for fever.  Genitourinary: Positive for flank pain and urgency.  All other systems reviewed and are negative.   Blood pressure 111/74, pulse (!)  103, temperature 98.9 F (37.2 C), temperature source Rectal, resp. rate 17, last menstrual period 07/19/2018,  SpO2 100 %. Physical Exam  Constitutional: She is oriented to person, place, and time. She appears well-developed.  HENT:  Head: Normocephalic.  Eyes: Pupils are equal, round, and reactive to light.  Neck: Normal range of motion.  Cardiovascular: Normal rate.  Respiratory: Effort normal.  GI:  Mild left CVAT at present  Musculoskeletal: Normal range of motion.  Neurological: She is alert and oriented to person, place, and time.  Skin: Skin is warm.  Psychiatric: She has a normal mood and affect.     Assessment/Plan  1 - Left Distal Ureteral Stone - options of MET, SWL, URS discussed. Given overall clinical picture and stone location I rec URS today v. Stenting alone pending intra-op findings as most definitive and expiditios management as she has failed MET now for several weeks. Risks, benefits, alternatives, expected peri-op course discussed.   2 -  Bacteruria - rocpehin given in ER per most recent CX. I do feel minimal manipulation stone removal and then stenting is acceptable. If stone impaced or requires any more than minimal manipulation than stenting alone preferred. Observe post-op to verify no escalation of any infectious parameters.   Alexis Frock, MD 08/18/2018, 1:30 PM

## 2018-08-18 NOTE — ED Notes (Signed)
Elaine Wong [phone] can call when patient is ready for discharge.

## 2018-08-18 NOTE — ED Provider Notes (Signed)
Patient evaluated at [facility] and transferred to [facility] long emergency department to be evaluated by urology.  Patient with concern for left-sided infected ureteral stone.  States her pain is currently 2/10.  Spoke with Dr. Tresa Moore who is aware the patient is in the emergency department.   Julianne Rice, MD 08/18/18 [phone]

## 2018-08-18 NOTE — ED Notes (Signed)
Pt was wiped down with CHG wipes and changed into a clean gown. Pt's belongings were placed into two pt belonging bags and placed on her bed.  Pt transported to PACU and left with surgical staff.

## 2018-08-18 NOTE — Brief Op Note (Signed)
08/18/2018  5:26 PM  PATIENT:  Festus Barren  18 y.o. female  PRE-OPERATIVE DIAGNOSIS:  kidney stones left  POST-OPERATIVE DIAGNOSIS:  kidney stones left  PROCEDURE:  Procedure(s): CYSTOSCOPY WITH RETROGRADE PYELOGRAM, URETEROSCOPY AND STENT PLACEMENT (Left) HOLMIUM LASER APPLICATION (Left)  SURGEON:  Surgeon(s) and Role:    Alexis Frock, MD - Primary  PHYSICIAN ASSISTANT:   ASSISTANTS: none   ANESTHESIA:   general  EBL:  minimal   BLOOD ADMINISTERED:none  DRAINS: none   LOCAL MEDICATIONS USED:  NONE  SPECIMEN:  Source of Specimen:  left ureteral stone fragments  DISPOSITION OF SPECIMEN:  [facility] for compositional analysis  COUNTS:  YES  TOURNIQUET:  * No tourniquets in log *  DICTATION: .Other Dictation: Dictation Number [id number]  PLAN OF CARE: Admit for overnight observation  PATIENT DISPOSITION:  PACU - hemodynamically stable.   Delay start of Pharmacological VTE agent (>24hrs) due to surgical blood loss or risk of bleeding: yes

## 2018-08-18 NOTE — ED Provider Notes (Signed)
[facility] Provider Note   CSN: [id number] Arrival date & time: 08/18/18  0110  History   Chief Complaint Chief Complaint  Patient presents with   Flank Pain    HPI Ala BentKara Nilson is a 18 y.o. female with past medical history significant for ADHD, anxiety, pyelonephritis who presents for evaluation of left flank pain.  Patient states she was seen in the emergency department approximately 2 weeks ago for dysuria and left flank pain.  Recently completed 10-day course of Keflex 3 days ago.  Patient states she has had a recurrent pain to her left flank.  Patient states she did also recently fall in the shower today and was unable to get up because of the pain.  Has had increased anxiety due to the pain.  Her original pain was rated 10/10 in triage.  She was given fentanyl from triage.  On my initial evaluation she rates her pain a 3/10 located to her left flank.  Has had some mild dysuria.  Denies pelvic pain, vaginal discharge or concerns for STDs.  Has had persistent nausea without emesis.  Denies additional aggravating or alleviating factors.  She is not taking anything for pain at home.  History obtained from patient and past medical records.  No interpreter was used.     HPI  Past Medical History:  Diagnosis Date   ADHD     There are no active problems to display for this patient.   History reviewed. No pertinent surgical history.   OB History   No obstetric history on file.      Home Medications    Prior to Admission medications   Medication Sig Start Date End Date Taking? Authorizing Provider  diclofenac (VOLTAREN) 75 MG EC tablet Take 75 mg by mouth 2 (two) times daily. 07/31/18  Yes [provider]  methylphenidate 54 MG PO CR tablet Take 54 mg by mouth every morning.   Yes [provider]  norethindrone-ethinyl estradiol (LOESTRIN FE) 1-20 MG-MCG tablet Take 1 tablet by mouth daily. 04/11/18  Yes [provider]  sertraline (ZOLOFT) 25 MG tablet Take 25 mg by mouth daily.   Yes [provider]  ibuprofen (ADVIL,MOTRIN) 600 MG tablet Take 1 tablet (600 mg total) by mouth every 6 (six) hours as needed. Patient not taking: Reported on 08/18/2018 05/20/17   Joy, Shawn C, PA-C  lidocaine (LIDODERM) 5 % Place 1 patch onto the skin daily. Remove & Discard patch within 12 hours or as directed by MD Patient not taking: Reported on 08/18/2018 05/20/17   Joy, Hillard DankerShawn C, PA-C  methocarbamol (ROBAXIN) 500 MG tablet Take 1 tablet (500 mg total) by mouth 2 (two) times daily. Patient not taking: Reported on 08/18/2018 05/20/17   Anselm PancoastJoy, Shawn C, PA-C    Family History No family history on file.  Social History Social History   Tobacco Use   Smoking status: Never Smoker   Smokeless tobacco: Never Used  Substance Use Topics   Alcohol use: No   Drug use: No     Allergies   Patient has no known allergies.   Review of Systems Review of Systems  Constitutional: Negative.   HENT: Negative.   Respiratory: Negative.   Cardiovascular: Negative.   Gastrointestinal: Negative.   Genitourinary: Positive for dysuria and flank pain. Negative for decreased urine volume, difficulty urinating, frequency, hematuria, menstrual problem, pelvic pain, urgency, vaginal bleeding, vaginal discharge and vaginal pain.  Psychiatric/Behavioral: The patient is nervous/anxious.  All other systems reviewed and are negative.    Physical Exam Updated Vital Signs BP 111/74 (BP Location: Left Arm)    Pulse (!) 103    Temp 98.9 F (37.2 C) (Rectal)    Resp 17    LMP 07/19/2018 (Approximate)    SpO2 100%   Physical Exam Vitals signs and nursing note reviewed.  Constitutional:      General: She is not in acute distress.    Appearance: She is well-developed. She is not ill-appearing or toxic-appearing.  HENT:     Head: Normocephalic and atraumatic.     Nose: Nose normal.     Mouth/Throat:     Mouth: Mucous  membranes are moist.     Pharynx: Oropharynx is clear.  Eyes:     Pupils: Pupils are equal, round, and reactive to light.  Neck:     Musculoskeletal: Normal range of motion.  Cardiovascular:     Rate and Rhythm: Normal rate.     Pulses: Normal pulses.     Heart sounds: Normal heart sounds.  Pulmonary:     Effort: Pulmonary effort is normal. No respiratory distress.     Breath sounds: Normal breath sounds.  Abdominal:     General: Bowel sounds are normal. There is no distension.     Palpations: Abdomen is soft.     Tenderness: There is no abdominal tenderness. There is left CVA tenderness. There is no right CVA tenderness, guarding or rebound. Negative signs include Murphy's sign and Rovsing's sign.     Hernia: No hernia is present.     Comments: Soft, nontender without rebound or guarding.  Mild left flank pain.  Musculoskeletal: Normal range of motion.     Comments: Moves all 4 extremities without difficulty.  Skin:    General: Skin is warm and dry.     Comments: No rashes or lesions.  Neurological:     Mental Status: She is alert.    ED Treatments / Results  Labs (all labs ordered are listed, but only abnormal results are displayed) Labs Reviewed  COMPREHENSIVE METABOLIC PANEL - Abnormal; Notable for the following components:      Result Value   Potassium 3.0 (*)    CO2 20 (*)    Glucose, Bld 136 (*)    BUN 26 (*)    Creatinine, Ser 1.19 (*)    Alkaline Phosphatase 35 (*)    All other components within normal limits  CBC - Abnormal; Notable for the following components:   WBC 17.5 (*)    All other components within normal limits  URINALYSIS, ROUTINE W REFLEX MICROSCOPIC - Abnormal; Notable for the following components:   APPearance CLOUDY (*)    Hgb urine dipstick SMALL (*)    Protein, ur 30 (*)    Leukocytes,Ua LARGE (*)    WBC, UA >50 (*)    Bacteria, UA RARE (*)    All other components within normal limits  URINE CULTURE  SARS CORONAVIRUS 2 (HOSPITAL ORDER,  PERFORMED IN [facility])  CULTURE, BLOOD (ROUTINE X 2)  CULTURE, BLOOD (ROUTINE X 2)  PREGNANCY, URINE  I-STAT BETA HCG BLOOD, ED ([facility], [facility], [facility] ONLY)   EKG None  Radiology Ct Abdomen Pelvis W Contrast  Result Date: 08/18/2018 CLINICAL DATA:  LEFT flank pain.  Pyelonephritis. EXAM: CT ABDOMEN AND PELVIS WITH CONTRAST TECHNIQUE: Multidetector CT imaging of the abdomen and pelvis was performed using the standard protocol following bolus administration of intravenous contrast. CONTRAST:  80mL  OMNIPAQUE IOHEXOL 300 MG/ML  SOLN COMPARISON:  None. FINDINGS: Lower chest: Lung bases are clear. Hepatobiliary: No focal hepatic lesion. No biliary duct dilatation. Gallbladder is normal. Common bile duct is normal. Pancreas: Pancreas is normal. No ductal dilatation. No pancreatic inflammation. Spleen: Normal spleen Adrenals/urinary tract: Adrenal glands are normal. There is mild hypoperfusion of the LEFT kidney compared to the RIGHT kidney. Mild renal edema on the LEFT. Mild delayed kidney active cysts and hydroureter on the LEFT. There is a calculus in the deep LEFT pelvis in the vicinity of the LEFT vesicoureteral junction measuring 3 mm (image 71/3.) RIGHT kidney is normal.  RIGHT ureter normal.  No bladder calculi. Stomach/Bowel: Stomach, small-bowel and cecum are normal. The appendix is not identified but there is no pericecal inflammation to suggest appendicitis. The colon and rectosigmoid colon are normal. Vascular/Lymphatic: Abdominal aorta is normal caliber. No periportal or retroperitoneal adenopathy. No pelvic adenopathy. Reproductive: Ovaries and uterus normal. Other: No free fluid. Musculoskeletal: No aggressive osseous lesion. IMPRESSION: 1. Mild renal edema on the LEFT kidney with mild hydroureter. Concern for obstructing calculus at the LEFT vesicoureteral junction. 2. Cannot exclude superimposed infection of the LEFT kidney. 3. RIGHT kidney normal. Electronically Signed   By: Suzy Bouchard M.D.   On: 08/18/2018 12:59    Procedures Procedures (including critical care time)  Medications Ordered in ED Medications  sodium chloride flush (NS) 0.9 % injection 3 mL (3 mLs Intravenous Not Given 08/18/18 1109)  fentaNYL (SUBLIMAZE) injection 50 mcg (50 mcg Intravenous Given 08/18/18 1134)  ondansetron (ZOFRAN) injection 4 mg (4 mg Intravenous Given 08/18/18 0147)  sodium chloride 0.9 % bolus 1,000 mL (1,000 mLs Intravenous New Bag/Given 08/18/18 1133)  cefTRIAXone (ROCEPHIN) 1 g in sodium chloride 0.9 % 100 mL IVPB (0 g Intravenous Stopped 08/18/18 1222)  iohexol (OMNIPAQUE) 300 MG/ML solution 80 mL (80 mLs Intravenous Contrast Given 08/18/18 1240)  ketorolac (TORADOL) 30 MG/ML injection 30 mg (30 mg Intravenous Given 08/18/18 1321)   Initial Impression / Assessment and Plan / ED Course  I have reviewed the triage vital signs and the nursing notes.  Pertinent labs & imaging results that were available during my care of the patient were reviewed by me and considered in my medical decision making (see chart for details).  18 year old female appears otherwise well presents for evaluation of left flank pain and urinary symptoms x  weeks.  Afebrile nonseptic appearing.  Has recently completed 10-day course of Keflex for possible pyelonephritis.  She has had recurrent chills at home and has had continued left flank pain.  Her pain began to be worse this morning.  Abdomen soft, nontender without rebound or guarding however she does have tenderness to her left flank.  No prior history of stones.  Heart and lungs clear. Tolerating p.o. intake at home without difficulty.  Will need to get CT scan to rule out infected stone vs pyelonephritis vs renal abscess. Patient upset about a 9 hour wait in the waiting room. Was able sit down and calm patient with discussion.  She agrees amenable to CT scan and repeat pain meds.  Previous labs reviewed from prior visit  Urinalysis with large leuks, rare  bacteria, greater than 50 WBC previous urinalysis grew out E. Coli Pregnancy negative CBC with leukocytosis at 17.0 Metabolic panel mild hypokalemia at 3.0, elevated glucose at 136, creatinine 1.19, previous 0.8  CT with Mild renal edema on the LEFT kidney with mild hydroureter. Concern for obstructing calculus at the LEFT vesicoureteral  junction. 2. Cannot exclude superimposed infection of the LEFT kidney.  Patient with continued pain.  Will give Toradol.  Patient has been given IV fluids and Rocephin.  Rectal temp 98 9.  Consulted with Dr. Berneice HeinrichManny, Urology who personally reviewed CT scan.  See is that there is a stone and given leukocytosis, elevated creatinine and positive urinalysis could possibly infected.  Recommends ED to ED transfer and patient will be taken to the OR for possible infected renal stone.  Patient continues to be n.p.o.  Her COVID test is pending.  Consult Dr. Particia NearingHaviland, [facility] who agrees to accept patient in transfer.  The patient appears reasonably stabilized for admission considering the current resources, flow, and capabilities available in the ED at this time, and I doubt any other [facility] requiring further screening and/or treatment in the ED prior to admission.       Final Clinical Impressions(s) / ED Diagnoses   Final diagnoses:  Renal stone  Pyelonephritis  Hypokalemia  AKI (acute kidney injury) (HCC)  Blood glucose elevated  Leukocytosis, unspecified type    ED Discharge Orders    None       Caidyn Blossom A, PA-C 08/18/18 1449    Alvira MondaySchlossman, Erin, MD 08/21/18 1604

## 2018-08-18 NOTE — ED Triage Notes (Signed)
Pt here two weeks ago for flank pain, dx with pyelonephritis , completed her 10 course day of antibiotics. C/o worsening pain to L flank today. Pt very anxious, currently hyperventilating, attempting to calm pt

## 2018-08-18 NOTE — ED Notes (Signed)
Pt friend Marval Regal would like update asap [phone]

## 2018-08-19 ENCOUNTER — Observation Stay (HOSPITAL_COMMUNITY)

## 2018-08-19 ENCOUNTER — Other Ambulatory Visit: Payer: Self-pay

## 2018-08-19 ENCOUNTER — Encounter (HOSPITAL_COMMUNITY): Payer: Self-pay | Admitting: Urology

## 2018-08-19 DIAGNOSIS — F909 Attention-deficit hyperactivity disorder, unspecified type: Secondary | ICD-10-CM | POA: Diagnosis present

## 2018-08-19 DIAGNOSIS — N179 Acute kidney failure, unspecified: Secondary | ICD-10-CM | POA: Diagnosis present

## 2018-08-19 DIAGNOSIS — Z20828 Contact with and (suspected) exposure to other viral communicable diseases: Secondary | ICD-10-CM | POA: Diagnosis present

## 2018-08-19 DIAGNOSIS — E876 Hypokalemia: Secondary | ICD-10-CM | POA: Diagnosis present

## 2018-08-19 DIAGNOSIS — N136 Pyonephrosis: Secondary | ICD-10-CM | POA: Diagnosis present

## 2018-08-19 DIAGNOSIS — B9620 Unspecified Escherichia coli [E. coli] as the cause of diseases classified elsewhere: Secondary | ICD-10-CM | POA: Diagnosis present

## 2018-08-19 MED ORDER — KCL IN DEXTROSE-NACL 20-5-0.45 MEQ/L-%-% IV SOLN
INTRAVENOUS | Status: DC
  Administered 2018-08-19 – 2018-08-20 (×2): via INTRAVENOUS
  Filled 2018-08-19 (×3): qty 1000

## 2018-08-19 MED ORDER — ONDANSETRON HCL 4 MG/2ML IJ SOLN
INTRAMUSCULAR | Status: AC
  Filled 2018-08-19: qty 2

## 2018-08-19 MED ORDER — ACETAMINOPHEN 325 MG PO TABS
650.0000 mg | ORAL_TABLET | Freq: Four times a day (QID) | ORAL | Status: DC | PRN
  Administered 2018-08-19 – 2018-08-20 (×4): 650 mg via ORAL
  Filled 2018-08-19 (×4): qty 2

## 2018-08-19 MED ORDER — ONDANSETRON HCL 4 MG/2ML IJ SOLN
4.0000 mg | Freq: Four times a day (QID) | INTRAMUSCULAR | Status: DC | PRN
  Administered 2018-08-19 – 2018-08-20 (×2): 4 mg via INTRAVENOUS
  Filled 2018-08-19: qty 2

## 2018-08-19 MED ORDER — GENTAMICIN SULFATE 40 MG/ML IJ SOLN
5.0000 mg/kg | INTRAVENOUS | Status: DC
  Administered 2018-08-19 – 2018-08-20 (×2): 310 mg via INTRAVENOUS
  Filled 2018-08-19 (×2): qty 7.75

## 2018-08-19 NOTE — Progress Notes (Signed)
Temp elevated to 103.2, Scheduled Tylenol has ended.  Notified Dr. Alinda Money (on call urology) regarding the above, orders received.

## 2018-08-19 NOTE — Op Note (Signed)
Elaine Wong, Early MEDICAL RECORD [id number] ACCOUNT [id number] DATE OF BIRTH:12-17-2000 FACILITY: [facility] LOCATION: [facility] PHYSICIAN:Tymeshia Awan, MD  OPERATIVE REPORT  DATE OF PROCEDURE:  08/18/2018  PREOPERATIVE DIAGNOSIS:  Left distal ureteral stone, refractory colic.  PROCEDURE: 1.  Cystoscopy, left retrograde pyelogram, interpretation. 2.  Left ureteroscopy with laser lithotripsy. 3.  Insertion of left ureteral stent 5 x 24 Polaris with tether.  DRAINS:  None.  COMPLICATIONS:  None.  SPECIMENS:  Left distal ureteral stone fragments for analysis.  FINDINGS: 1.  Moderately impacted left distal ureteral stone with very mild hydronephrosis above this. 2.  Successful removal of stone fragmentation and removal of stone fragments in the distal ureter following laser lithotripsy and basket extraction. 3.  Successful placement of left ureteral stent, proximal end in renal pelvis, distal end in urinary bladder.  INDICATIONS:  The patient is a pleasant 18 year old lady who was found on workup of persistent irritative voiding left flank pain to have a left distal ureteral stone by CT imaging today.  She has had similar symptoms now for 3 weeks.  She did have some  bacteriuria several weeks ago.  She was on culture-specific antibiotics for this.  Today her infectious parameters are unremarkable after being on culture-specific therapy.  Options were discussed for management including continued medical therapy versus  laser lithotripsy versus ureteroscopy, and she wished to proceed with the latter.  Informed consent was obtained and placed in the medical record.  Notably, the plan is for ureteroscopy only if stone appears to be amenable to fragmentation and removal  using a no to minimal pressure scenario.  PROCEDURE IN DETAIL:  The patient being identified and the procedure being left ureteroscopic stimulation versus stenting alone was confirmed.  Procedure timeout was performed.   Intravenous antibiotics were administered.  General LMA anesthesia was  induced.  The patient was placed into a low lithotomy position.  A sterile field was created by prepping and draping the patient's vagina, introitus, and proximal thighs using iodine.  Cystourethroscopy was performed using a 21-French rigid cystoscope  with offset lens.  Inspection of bladder revealed no diverticula, calcifications, or papillary lesions.  Notably, there was a delayed cystogram since the patient was given prior contrast.  There was no delayed nephrogram on the left, which did  corroborate not complete obstruction.  The left ureteral orifice was cannulated with a 6-French renal catheter, and very gentle left retrograde pyelogram was obtained.  Left retrograde pyelogram of the distal-most ureter did reveal a stone in the distal ureter, likely at the intramural ureter with very mild dilation above this.  A 0.038 ZIPwire was advanced to the level of the upper pole and set aside as a safety wire.   An 8-French feeding tube was placed in the urinary bladder for pressure release, and semirigid ureteroscopy of the distal left ureter alongside a separate sensor working wire using a no pressure retrograde flow only technique.  The distal stone in  question was visualized.  A single attempt at basketing was performed; however, due to mild mucosal edema and impaction, it was not amenable to this.  As such, holmium laser energy applied at a setting of 0.2 joules and 20 Hz with a 200 nanometer fiber,  and the stone was fragmented into 3 smaller pieces that were then easily grasped and removed in their entirety and set aside for composition analysis.  Again, the technique being used is an antegrade flow only, no retrograde pressure technique.  Given  the patient's recent prior  bacteriuria,  it was felt that brief interval stenting with a tethered stent would be warranted as well as observation overnight on continuous IV antibiotics,  and a new 5 x 24 Polaris-type stent was placed with remaining safety  wire using fluoroscopic guidance.  Good proximal and distal planes were noted.  Tether was left in place and fashioned to the thigh.  The bladder was entered per cystoscope.  Procedure was then terminated.  The patient tolerated the procedure well.  No  immediate perioperative complications.  The patient was taken to postanesthesia care in stable condition with plan for observation overnight.  LN/NUANCE  D:08/18/2018 T:08/18/2018 JOB:[id number]

## 2018-08-19 NOTE — Progress Notes (Signed)
1 Day Post-Op   Subjective/Chief Complaint:  1 - Left Distal Ureteral Stone - s/p left ureteroscopy / laser / tethered stent 08/18/18, day of admission, for 74mm left very distal stone by ER CT 08/2018 on eval persistant irritative voiding and left flank pain.   2 -  Bacteruria / Pyelonephritis - pan-sensitive e. Coli bacteruria 07/2018 and treated with ceph course.  UA 08/2018 with scant bacteruria, no fevers, WBC 17k (mild elevation). Placed on rocephin peri-op and some intermitant fevers post-op c/w mild pyelo. Fu KUB 8/16 with stent in good position.   Today " Elaine Wong " is stable. Some spiking fevers overnight. She remains on rocephin according to most recent CX.    Objective: Vital signs in last 24 hours: Temp:  [98.2 F (36.8 C)-101.5 F (38.6 C)] 99 F (37.2 C) (08/16 0734) Pulse Rate:  [68-103] 92 (08/16 0734) Resp:  [14-25] 14 (08/16 0734) BP: (92-111)/(52-74) 101/63 (08/16 0734) SpO2:  [97 %-100 %] 99 % (08/16 0734) Weight:  [62.5 kg] 62.5 kg (08/16 0400) Last BM Date: 08/17/18  Intake/Output from previous day: 08/15 0701 - 08/16 0700 In: 800 [I.V.:800] Out: 700 [Urine:700] Intake/Output this shift: No intake/output data recorded.  General appearance: alert, cooperative and appears stated age Eyes: negative Nose: Nares normal. Septum midline. Mucosa normal. No drainage or sinus tenderness. Throat: lips, mucosa, and tongue normal; teeth and gums normal Neck: supple, symmetrical, trachea midline Back: symmetric, no curvature. ROM normal. No CVA tenderness. Resp: non-labored on room air Cardio: mild regular tachy in 90s.  GI: soft, non-tender; bowel sounds normal; no masses,  no organomegaly Extremities: extremities normal, atraumatic, no cyanosis or edema Pulses: 2+ and symmetric Skin: Skin color, texture, turgor normal. No rashes or lesions Lymph nodes: Cervical, supraclavicular, and axillary nodes normal. Neurologic: Grossly normal  Stent tether in place in  appropriate position Rt inner thigh.   Lab Results:  Recent Labs    08/18/18 0151  WBC 17.5*  HGB 14.8  HCT 42.8  PLT 317   BMET Recent Labs    08/18/18 0151  NA 138  K 3.0*  CL 105  CO2 20*  GLUCOSE 136*  BUN 26*  CREATININE 1.19*  CALCIUM 10.1   PT/INR No results for input(s): LABPROT, INR in the last 72 hours. ABG No results for input(s): PHART, HCO3 in the last 72 hours.  Invalid input(s): PCO2, PO2  Studies/Results: Ct Abdomen Pelvis W Contrast  Result Date: 08/18/2018 CLINICAL DATA:  LEFT flank pain.  Pyelonephritis. EXAM: CT ABDOMEN AND PELVIS WITH CONTRAST TECHNIQUE: Multidetector CT imaging of the abdomen and pelvis was performed using the standard protocol following bolus administration of intravenous contrast. CONTRAST:  67mL OMNIPAQUE IOHEXOL 300 MG/ML  SOLN COMPARISON:  None. FINDINGS: Lower chest: Lung bases are clear. Hepatobiliary: No focal hepatic lesion. No biliary duct dilatation. Gallbladder is normal. Common bile duct is normal. Pancreas: Pancreas is normal. No ductal dilatation. No pancreatic inflammation. Spleen: Normal spleen Adrenals/urinary tract: Adrenal glands are normal. There is mild hypoperfusion of the LEFT kidney compared to the RIGHT kidney. Mild renal edema on the LEFT. Mild delayed kidney active cysts and hydroureter on the LEFT. There is a calculus in the deep LEFT pelvis in the vicinity of the LEFT vesicoureteral junction measuring 3 mm (image 71/3.) RIGHT kidney is normal.  RIGHT ureter normal.  No bladder calculi. Stomach/Bowel: Stomach, small-bowel and cecum are normal. The appendix is not identified but there is no pericecal inflammation to suggest appendicitis. The colon and rectosigmoid colon  are normal. Vascular/Lymphatic: Abdominal aorta is normal caliber. No periportal or retroperitoneal adenopathy. No pelvic adenopathy. Reproductive: Ovaries and uterus normal. Other: No free fluid. Musculoskeletal: No aggressive osseous lesion.  IMPRESSION: 1. Mild renal edema on the LEFT kidney with mild hydroureter. Concern for obstructing calculus at the LEFT vesicoureteral junction. 2. Cannot exclude superimposed infection of the LEFT kidney. 3. RIGHT kidney normal. Electronically Signed   By: Genevive BiStewart  Edmunds M.D.   On: 08/18/2018 12:59   Dg C-arm 1-60 Min-no Report  Result Date: 08/18/2018 Fluoroscopy was utilized by the requesting physician.  No radiographic interpretation.    Anti-infectives: Anti-infectives (From admission, onward)   Start     Dose/Rate Route Frequency Ordered Stop   08/19/18 1000  cefTRIAXone (ROCEPHIN) 1 g in sodium chloride 0.9 % 100 mL IVPB     1 g 200 mL/hr over 30 Minutes Intravenous Every 24 hours 08/18/18 1910     08/18/18 1030  cefTRIAXone (ROCEPHIN) 1 g in sodium chloride 0.9 % 100 mL IVPB     1 g 200 mL/hr over 30 Minutes Intravenous  Once 08/18/18 1024 08/18/18 1222      Assessment/Plan:  1 - Left Distal Ureteral Stone - now stone free. Current stent to remain until afebrile x 24 hours.   2 -  Bacteruria / Pyelonephritis - continue rocephin, KUB today verifies stent in good position. Remain in house on IV ABX until afebrile x 24 hours and she is in agreement. Add Gent for double coverage as she still eshibits some intectious parameters despite prior CX-specific therapy.   Elaine Acheheodore Tyshan Wong 08/19/2018

## 2018-08-20 LAB — CBC
HCT: 35 % — ABNORMAL LOW (ref 36.0–46.0)
Hemoglobin: 11.4 g/dL — ABNORMAL LOW (ref 12.0–15.0)
MCH: 31.8 pg (ref 26.0–34.0)
MCHC: 32.6 g/dL (ref 30.0–36.0)
MCV: 97.5 fL (ref 80.0–100.0)
Platelets: 158 10*3/uL (ref 150–400)
RBC: 3.59 MIL/uL — ABNORMAL LOW (ref 3.87–5.11)
RDW: 13.3 % (ref 11.5–15.5)
WBC: 9.3 10*3/uL (ref 4.0–10.5)
nRBC: 0 % (ref 0.0–0.2)

## 2018-08-20 LAB — CREATININE, SERUM
Creatinine, Ser: 0.89 mg/dL (ref 0.44–1.00)
GFR calc Af Amer: >60 mL/min (ref >60)
GFR calc non Af Amer: >60 mL/min (ref >60)

## 2018-08-20 MED ORDER — CIPROFLOXACIN HCL 500 MG PO TABS: 500.0000 mg | ORAL_TABLET | Freq: Two times a day (BID) | ORAL | 0 refills | Status: DC

## 2018-08-20 MED ORDER — SENNOSIDES-DOCUSATE SODIUM 8.6-50 MG PO TABS: 1.0000 | ORAL_TABLET | Freq: Two times a day (BID) | ORAL | 0 refills | Status: DC

## 2018-08-20 MED ORDER — OXYCODONE-ACETAMINOPHEN 5-325 MG PO TABS: 1.0000 | ORAL_TABLET | Freq: Three times a day (TID) | ORAL | 0 refills | Status: DC | PRN

## 2018-08-20 MED ORDER — ONDANSETRON HCL 4 MG PO TABS: 4.0000 mg | ORAL_TABLET | Freq: Every day | ORAL | 0 refills | Status: DC | PRN

## 2018-08-20 NOTE — Discharge Instructions (Signed)
1 - You may have urinary urgency (bladder spasms) and bloody urine on / off with stent in place. This is normal.  2 - Remove tethered stent on Wednesday morning at home by pulling on string, then blue-white plastic tubing and discarding. Office is open Wednesday if problems arise.   3 - Call MD or go to ER for fever >102, severe pain / nausea / vomiting not relieved by medications, or acute change in medical status.

## 2018-08-20 NOTE — Discharge Summary (Signed)
Physician Discharge Summary  Patient ID: Elaine Wong MRN: [id number] DOB/AGE: Aug 27, 2000 18 y.o.  Admit date: 08/18/2018 Discharge date: 08/20/2018  Admission Diagnoses: Ureteral Stone  Discharge Diagnoses:  Active Problems:   Ureteral stone with hydronephrosis Pyelonephritis  Discharged Condition: good  Hospital Course:   1 - Left Distal Ureteral Stone- s/p left ureteroscopy / laser / tethered stent 08/18/18, day of admission, for 53mm left very distal stone by ER CT 08/2018 on eval persistant irritative voiding and left flank pain.   2 - Bacteruria / Pyelonephritis- pan-sensitive e. Coli bacteruria 07/2018 and treated with ceph course. UA 08/2018 with scant bacteruria, no fevers, WBC 17k (mild elevation).Placed on rocephin peri-op and some intermitant fevers post-op c/w mild pyelo. Placed on [place] for double coverage 8/16. Fu KUB 8/16 with stent in good position. [place] 8/15 no growth.   By the afternoon of 8/17, she is ambulatory, pain controlled on PO meds, no high grade fevers in 24 hours, tollerating PO nutrition, and felt to be adequate for discharge.    Consults: None  Significant Diagnostic Studies: labs: as per above  Treatments: surgery: and IV antibiotics as per above.   Discharge Exam: Blood pressure 100/64, pulse 90, temperature 99.8 F (37.7 C), temperature source Oral, resp. rate 16, height 5\' 6"  (1.676 m), weight 62.5 kg, SpO2 97 %. General appearance: alert, cooperative, appears stated age and mother at bedside Eyes: negative Nose: Nares normal. Septum midline. Mucosa normal. No drainage or sinus tenderness. Throat: lips, mucosa, and tongue normal; teeth and gums normal Neck: supple, symmetrical, trachea midline Back: symmetric, no curvature. ROM normal. No CVA tenderness. Resp: non-labored on room air Cardio: Nl rate GI: soft, non-tender; bowel sounds normal; no masses,  no organomegaly Pelvic: tethered stent in place with tether taped to inner thigh. Mother  as chaparone.  Extremities: extremities normal, atraumatic, no cyanosis or edema Skin: Skin color, texture, turgor normal. No rashes or lesions Lymph nodes: Cervical, supraclavicular, and axillary nodes normal. Neurologic: Grossly normal  Disposition: HOME   Allergies as of 08/20/2018      Reactions   Lactose Intolerance (gi)       Medication List    STOP taking these medications   ibuprofen 600 MG tablet Commonly known as: ADVIL   lidocaine 5 % Commonly known as: Lidoderm   methocarbamol 500 MG tablet Commonly known as: ROBAXIN     TAKE these medications   ciprofloxacin 500 MG tablet Commonly known as: Cipro Take 1 tablet (500 mg total) by mouth 2 (two) times daily. X 4 days starting Tuesday morning for infection   diclofenac 75 MG EC tablet Commonly known as: VOLTAREN Take 75 mg by mouth 2 (two) times daily.   methylphenidate 54 MG CR tablet Commonly known as: CONCERTA Take 54 mg by mouth every morning.   norethindrone-ethinyl estradiol 1-20 MG-MCG tablet Commonly known as: LOESTRIN FE Take 1 tablet by mouth daily.   ondansetron 4 MG tablet Commonly known as: Zofran Take 1 tablet (4 mg total) by mouth daily as needed for nausea or vomiting.   oxyCODONE-acetaminophen 5-325 MG tablet Commonly known as: Percocet Take 1-2 tablets by mouth every 8 (eight) hours as needed for moderate pain or severe pain. Post-operatively   senna-docusate 8.6-50 MG tablet Commonly known as: Senokot-S Take 1 tablet by mouth 2 (two) times daily. While taking strong pain meds to prevent constipation   sertraline 25 MG tablet Commonly known as: ZOLOFT Take 25 mg by mouth daily.  Signed: Sebastian Acheheodore Masin Shatto 08/20/2018, 5:20 PM

## 2018-08-23 LAB — CULTURE, BLOOD (ROUTINE X 2)
Culture: NO GROWTH
Special Requests: ADEQUATE

## 2018-10-19 ENCOUNTER — Other Ambulatory Visit: Payer: Self-pay

## 2018-10-19 ENCOUNTER — Encounter (HOSPITAL_COMMUNITY): Payer: Self-pay | Admitting: Emergency Medicine

## 2018-10-19 ENCOUNTER — Emergency Department (HOSPITAL_COMMUNITY)
Admission: EM | Admit: 2018-10-19 | Discharge: 2018-10-19 | Disposition: A | Discharge status: Other Acute Inpatient Hospital | Payer: BC Managed Care – PPO | Attending: Emergency Medicine | Admitting: Emergency Medicine

## 2018-10-19 DIAGNOSIS — R4689 Other symptoms and signs involving appearance and behavior: Secondary | ICD-10-CM | POA: Insufficient documentation

## 2018-10-19 DIAGNOSIS — F909 Attention-deficit hyperactivity disorder, unspecified type: Secondary | ICD-10-CM | POA: Diagnosis not present

## 2018-10-19 DIAGNOSIS — Z20828 Contact with and (suspected) exposure to other viral communicable diseases: Secondary | ICD-10-CM | POA: Insufficient documentation

## 2018-10-19 DIAGNOSIS — Z79899 Other long term (current) drug therapy: Secondary | ICD-10-CM | POA: Insufficient documentation

## 2018-10-19 DIAGNOSIS — R440 Auditory hallucinations: Secondary | ICD-10-CM | POA: Diagnosis not present

## 2018-10-19 DIAGNOSIS — R45851 Suicidal ideations: Secondary | ICD-10-CM | POA: Insufficient documentation

## 2018-10-19 DIAGNOSIS — R443 Hallucinations, unspecified: Secondary | ICD-10-CM

## 2018-10-19 DIAGNOSIS — Z793 Long term (current) use of hormonal contraceptives: Secondary | ICD-10-CM | POA: Insufficient documentation

## 2018-10-19 LAB — RAPID URINE DRUG SCREEN, HOSP PERFORMED
Amphetamines: NOT DETECTED
Barbiturates: NOT DETECTED
Benzodiazepines: NOT DETECTED
Cocaine: NOT DETECTED
Opiates: NOT DETECTED
Tetrahydrocannabinol: NOT DETECTED

## 2018-10-19 LAB — COMPREHENSIVE METABOLIC PANEL
ALT: 14 U/L (ref 0–44)
AST: 17 U/L (ref 15–41)
Albumin: 4.3 g/dL (ref 3.5–5.0)
Alkaline Phosphatase: 31 U/L — ABNORMAL LOW (ref 38–126)
Anion gap: 10 (ref 5–15)
BUN: 10 mg/dL (ref 6–20)
CO2: 23 mmol/L (ref 22–32)
Calcium: 9.6 mg/dL (ref 8.9–10.3)
Chloride: 105 mmol/L (ref 98–111)
Creatinine, Ser: 0.78 mg/dL (ref 0.44–1.00)
GFR calc Af Amer: >60 mL/min (ref >60)
GFR calc non Af Amer: >60 mL/min (ref >60)
Glucose, Bld: 95 mg/dL (ref 70–99)
Potassium: 3.2 mmol/L — ABNORMAL LOW (ref 3.5–5.1)
Sodium: 138 mmol/L (ref 135–145)
Total Bilirubin: 0.4 mg/dL (ref 0.3–1.2)
Total Protein: 7.3 g/dL (ref 6.5–8.1)

## 2018-10-19 LAB — CBC
HCT: 43.4 % (ref 36.0–46.0)
Hemoglobin: 14.9 g/dL (ref 12.0–15.0)
MCH: 32 pg (ref 26.0–34.0)
MCHC: 34.3 g/dL (ref 30.0–36.0)
MCV: 93.1 fL (ref 80.0–100.0)
Platelets: 221 10*3/uL (ref 150–400)
RBC: 4.66 MIL/uL (ref 3.87–5.11)
RDW: 12.9 % (ref 11.5–15.5)
WBC: 8 10*3/uL (ref 4.0–10.5)
nRBC: 0 % (ref 0.0–0.2)

## 2018-10-19 LAB — ETHANOL: Alcohol, Ethyl (B): <10 mg/dL (ref <10)

## 2018-10-19 LAB — SARS CORONAVIRUS 2 BY RT PCR (HOSPITAL ORDER, PERFORMED IN ~~LOC~~ HOSPITAL LAB): SARS Coronavirus 2: NEGATIVE

## 2018-10-19 LAB — I-STAT BETA HCG BLOOD, ED (MC, WL, AP ONLY): I-stat hCG, quantitative: <5 m[IU]/mL (ref <5)

## 2018-10-19 LAB — ACETAMINOPHEN LEVEL: Acetaminophen (Tylenol), Serum: <10 ug/mL — ABNORMAL LOW (ref 10–30)

## 2018-10-19 LAB — SALICYLATE LEVEL: Salicylate Lvl: <7 mg/dL (ref 2.8–30.0)

## 2018-10-19 MED ORDER — POTASSIUM CHLORIDE CRYS ER 20 MEQ PO TBCR
40.0000 meq | EXTENDED_RELEASE_TABLET | Freq: Once | ORAL | Status: AC
  Administered 2018-10-19: 07:00:00 40 meq via ORAL
  Filled 2018-10-19: qty 2

## 2018-10-19 MED ORDER — SERTRALINE HCL 25 MG PO TABS
25.0000 mg | ORAL_TABLET | Freq: Every day | ORAL | Status: DC
  Administered 2018-10-19: 25 mg via ORAL
  Filled 2018-10-19 (×2): qty 1

## 2018-10-19 MED ORDER — METHYLPHENIDATE HCL ER (OSM) 27 MG PO TBCR: 54.0000 mg | EXTENDED_RELEASE_TABLET | ORAL | Status: DC

## 2018-10-19 NOTE — ED Notes (Signed)
[facility] sitter at bedside.  TTS set-up.

## 2018-10-19 NOTE — ED Notes (Signed)
Walked patient to transport vehicle, spoke with driver.  Patient got in car without any difficulty

## 2018-10-19 NOTE — ED Provider Notes (Signed)
[facility] EMERGENCY DEPARTMENT Provider Note   CSN: [id number] Arrival date & time: 10/19/18  0145     History   Chief Complaint Chief Complaint  Patient presents with  . Suicidal    HPI Elaine Wong is a 18 y.o. female.     The history is provided by the patient.  Mental Health Problem Presenting symptoms: hallucinations   Degree of incapacity (severity):  Moderate Onset quality:  Gradual Duration: Unknown. Timing:  Constant Progression:  Worsening Chronicity:  New Relieved by:  Nothing Worsened by:  Nothing Associated symptoms: no abdominal pain, no chest pain and no headaches   Patient history of ADHD presents for evaluation.  Patient apparently was having auditory hallucinations.  Patient had reported hearing sex sounds, but her roommates told her that this is not occurring.  Patient became frustrated, and is now here for evaluation.  Patient denies any acute complaints this time.  She denies fever or vomiting.  No chest pain or vomiting. Also reported the patient has become destructive at her house Past Medical History:  Diagnosis Date  . ADHD     Patient Active Problem List   Diagnosis Date Noted  . Ureteral stone with hydronephrosis 08/18/2018    Past Surgical History:  Procedure Laterality Date  . CYSTOSCOPY WITH RETROGRADE PYELOGRAM, URETEROSCOPY AND STENT PLACEMENT Left 08/18/2018   Procedure: CYSTOSCOPY WITH RETROGRADE PYELOGRAM, URETEROSCOPY AND STENT PLACEMENT;  Surgeon: Sebastian Ache, MD;  Location: [facility];  Service: Urology;  Laterality: Left;  . HOLMIUM LASER APPLICATION Left 08/18/2018   Procedure: HOLMIUM LASER APPLICATION;  Surgeon: Sebastian Ache, MD;  Location: [facility];  Service: Urology;  Laterality: Left;     OB History   No obstetric history on file.      Home Medications    Prior to Admission medications   Medication Sig Start Date End Date Taking? Authorizing Provider  methylphenidate 54 MG PO CR tablet Take  54 mg by mouth every morning.    [provider]  norethindrone-ethinyl estradiol (LOESTRIN FE) 1-20 MG-MCG tablet Take 1 tablet by mouth daily. 04/11/18   [provider]  ondansetron (ZOFRAN) 4 MG tablet Take 1 tablet (4 mg total) by mouth daily as needed for nausea or vomiting. 08/20/18 08/20/19  Sebastian Ache, MD  oxyCODONE-acetaminophen (PERCOCET) 5-325 MG tablet Take 1-2 tablets by mouth every 8 (eight) hours as needed for moderate pain or severe pain. Post-operatively 08/20/18 08/20/19  Sebastian Ache, MD  senna-docusate (SENOKOT-S) 8.6-50 MG tablet Take 1 tablet by mouth 2 (two) times daily. While taking strong pain meds to prevent constipation 08/20/18   Sebastian Ache, MD  sertraline (ZOLOFT) 25 MG tablet Take 25 mg by mouth daily.    [provider]    Family History No family history on file.  Social History Social History   Tobacco Use  . Smoking status: Never Smoker  . Smokeless tobacco: Never Used  Substance Use Topics  . Alcohol use: No  . Drug use: No     Allergies   Lactose intolerance (gi)   Review of Systems Review of Systems  Constitutional: Negative for fever.  Cardiovascular: Negative for chest pain.  Gastrointestinal: Negative for abdominal pain and vomiting.  Neurological: Negative for headaches.  Psychiatric/Behavioral: Positive for hallucinations.  All other systems reviewed and are negative.    Physical Exam Updated Vital Signs BP (!) 122/96 (BP Location: Left Arm)   Pulse 98   Temp 98.4 F (36.9 C) (Oral)   Resp 16  LMP 10/12/2018   SpO2 100%   Physical Exam CONSTITUTIONAL: Well developed/well nourished, resting comfortably HEAD: Normocephalic/atraumatic EYES: EOMI ENMT: Mucous membranes moist NECK: supple no meningeal signs CV: S1/S2 noted, no murmurs/rubs/gallops noted LUNGS: Lungs are clear to auscultation bilaterally, no apparent distress ABDOMEN: soft, nontender NEURO: Pt is awake/alert/appropriate,  moves all extremitiesx4.  No facial droop.   EXTREMITIES: pulses normal/equal, full ROM SKIN: warm, color normal PSYCH: no abnormalities of mood noted, alert and oriented to situation   ED Treatments / Results  Labs (all labs ordered are listed, but only abnormal results are displayed) Labs Reviewed  COMPREHENSIVE METABOLIC PANEL - Abnormal; Notable for the following components:      Result Value   Potassium 3.2 (*)    Alkaline Phosphatase 31 (*)    All other components within normal limits  ACETAMINOPHEN LEVEL - Abnormal; Notable for the following components:   Acetaminophen (Tylenol), Serum <10 (*)    All other components within normal limits  SARS CORONAVIRUS 2 BY RT PCR (HOSPITAL ORDER, [place] [facility])  ETHANOL  SALICYLATE LEVEL  CBC  RAPID URINE DRUG SCREEN, HOSP PERFORMED  I-STAT BETA HCG BLOOD, ED ([facility], [facility], [facility] ONLY)    EKG None  Radiology No results found.  Procedures Procedures  Medications Ordered in ED Medications  methylphenidate (CONCERTA) CR tablet 54 mg (has no administration in time range)  sertraline (ZOLOFT) tablet 25 mg (has no administration in time range)     Initial Impression / Assessment and Plan / ED Course  I have reviewed the triage vital signs and the nursing notes.  Pertinent labs results that were available during my care of the patient were reviewed by me and considered in my medical decision making (see chart for details).       5:05 AM Labs overall reassuring.  Vitals appropriate.  Patient awake alert in no distress.  Patient is medically stable.  Psychiatric treatment plans admission  Final Clinical Impressions(s) / ED Diagnoses   Final diagnoses:  Hallucinations    ED Discharge Orders    None       Ripley Fraise, MD 10/19/18 9735

## 2018-10-19 NOTE — ED Notes (Signed)
Pt and tech has attempted to remove pt's nose piercing (stud) without success.  Pt states she has never taken it out since she got it.

## 2018-10-19 NOTE — Progress Notes (Signed)
Pt accepted to [facility] [place] [facility]       Dr. Jonelle Sports is the attending provider.    Call report to [phone]      Pt is voluntary and will be transported by [employer]  Pt may arrive as soon as transportation is arranged.   Audree Camel, LCSW, [place] Disposition [facility]/TTS [phone]
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
## 2018-10-19 NOTE — Progress Notes (Signed)
Pt meets inpatient criteria per Anette Riedel, NP. Referral information has been sent to the following hospitals for review:  [facility]        Disposition will continue to follow for inpatient placement needs.   Audree Camel, LCSW, [place] Disposition [facility]/TTS [phone]

## 2018-10-19 NOTE — ED Triage Notes (Addendum)
Pt states that when she hears "sex noises" she gets really upset because she thinks it triggers other memories of when she first had sex at age 18.  States she has told her neighbors about this and now they make fun of her and make loud sex noises for her to hear.  States tonight her roommates kept telling her that she really wasn't hearing the noises and that she had to get some help.  Pt has been seeing a psychiatrist and has had medication increased.  When her roommates were telling her to stop talking about the sex noises tonight she said that she told them she was going to do something "destructive".  She went to top of parking deck and sat on the hood of her car and sang "Jesus songs".  States she isn't sure that she could go through life jumping because she is afraid of heights and her religion tells her it is selfish to take your own life.  Pt changed into paper scrubs, security called to wand pt, [facility] sitter requested, belongings bagged, labeled and placed in locker #6 .    [employer] police officers here with pt and report that they got to pt's apartment after she returned home from parking deck.  Pt has been recording 30 min episodes and playing them back to roommates and they reported hearing nothing when she states she is hearing loud sex noises.  Roommates are wanting to have pt evicted because she is punching the walls and throwing things.  Pt is voluntary at this time.  [employer] police said to please call them if pt needs ride back and they will come get her.

## 2018-11-21 ENCOUNTER — Encounter: Payer: Self-pay | Admitting: Psychiatry

## 2018-11-21 ENCOUNTER — Ambulatory Visit (INDEPENDENT_AMBULATORY_CARE_PROVIDER_SITE_OTHER): Payer: BC Managed Care – PPO | Admitting: Psychiatry

## 2018-11-21 ENCOUNTER — Other Ambulatory Visit: Payer: Self-pay

## 2018-11-21 VITALS — Ht 66.0 in | Wt 134.0 lb

## 2018-11-21 DIAGNOSIS — F340 Cyclothymic disorder: Secondary | ICD-10-CM | POA: Diagnosis not present

## 2018-11-21 DIAGNOSIS — F902 Attention-deficit hyperactivity disorder, combined type: Secondary | ICD-10-CM | POA: Insufficient documentation

## 2018-11-21 DIAGNOSIS — F23 Brief psychotic disorder: Secondary | ICD-10-CM

## 2018-11-21 DIAGNOSIS — F411 Generalized anxiety disorder: Secondary | ICD-10-CM | POA: Insufficient documentation

## 2018-11-21 MED ORDER — BUPROPION HCL ER (XL) 150 MG PO TB24: 150.0000 mg | ORAL_TABLET | Freq: Every day | ORAL | 1 refills | Status: DC

## 2018-11-21 MED ORDER — QUETIAPINE FUMARATE 25 MG PO TABS: 25.0000 mg | ORAL_TABLET | Freq: Every day | ORAL | 1 refills | Status: DC
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
## 2018-11-26 ENCOUNTER — Other Ambulatory Visit: Payer: Self-pay

## 2018-11-26 DIAGNOSIS — Z20822 Contact with and (suspected) exposure to covid-19: Secondary | ICD-10-CM

## 2018-11-28 LAB — NOVEL CORONAVIRUS, NAA: SARS-CoV-2, NAA: NOT DETECTED

## 2018-12-19 ENCOUNTER — Encounter: Payer: Self-pay | Admitting: Psychiatry

## 2018-12-19 ENCOUNTER — Ambulatory Visit (INDEPENDENT_AMBULATORY_CARE_PROVIDER_SITE_OTHER): Payer: BC Managed Care – PPO | Admitting: Psychiatry

## 2018-12-19 DIAGNOSIS — F411 Generalized anxiety disorder: Secondary | ICD-10-CM

## 2018-12-19 DIAGNOSIS — F340 Cyclothymic disorder: Secondary | ICD-10-CM | POA: Diagnosis not present

## 2018-12-19 DIAGNOSIS — F23 Brief psychotic disorder: Secondary | ICD-10-CM | POA: Diagnosis not present

## 2018-12-19 DIAGNOSIS — F902 Attention-deficit hyperactivity disorder, combined type: Secondary | ICD-10-CM | POA: Diagnosis not present

## 2018-12-19 MED ORDER — METHYLPHENIDATE HCL ER (OSM) 36 MG PO TBCR: 36.0000 mg | EXTENDED_RELEASE_TABLET | Freq: Every day | ORAL | 0 refills | Status: AC
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
## 2019-10-23 ENCOUNTER — Encounter: Payer: Self-pay | Admitting: Psychiatry

## 2020-06-01 ENCOUNTER — Emergency Department: Admit: 2020-06-01 | Discharge: 2020-06-01 | Payer: BC Managed Care – PPO

## 2020-06-01 DIAGNOSIS — N200 Calculus of kidney: Secondary | ICD-10-CM

## 2020-06-01 DIAGNOSIS — R109 Unspecified abdominal pain: Secondary | ICD-10-CM

## 2020-06-01 LAB — URINALYSIS WITH REFLEX TO CULTURE
Bilirubin, Urine: NEGATIVE
Glucose, (UA): NEGATIVE mg/dL
Ketones, UA: NEGATIVE mg/dL
Nitrite: NEGATIVE
Protein, UA: NEGATIVE mg/dL
RBCs, urine: 3 /HPF (ref <3)
Round Epith Cells: 0 /LPF
Specific Gravity: 1.003 (ref 1.002–1.030)
Squam Epith Cells: 0 /LPF
Urobilinogen: NEGATIVE mg/dL(EU/dL)
WBC Esterase: NEGATIVE
WBCs, UR: <5 /HPF (ref <5)
pH, UA: 6.5 (ref 4.5–8.0)

## 2020-06-01 MED ORDER — SODIUM CHLORIDE 0.9 % IV BOLUS
0.9 % | INTRAVENOUS | Status: AC
  Administered 2020-06-02: 02:00:00 via INTRAVENOUS

## 2020-06-01 MED ORDER — KETOROLAC 15 MG/ML INJECTION SOLUTION
15 mg/mL | INTRAMUSCULAR | Status: AC
  Administered 2020-06-02: 02:00:00 15 mg via INTRAVENOUS

## 2020-06-01 NOTE — Unmapped (Signed)
It was our pleasure to care for you at the [facility] Emergency Department.  You presented with several days of pain with urination and flank pain.  You were evaluated with a physical exam, urine test, and CT imaging of your abdomen (to look for a kidney stone) which were all normal.    We recommend:  - Please complete your antibiotic course as prescribed  - Follow up with your primary care physician as needed    Seek care if you experience fever, severe abdominal pain, inability to eat/drink, or any new or worsening symptoms.

## 2020-06-01 NOTE — ED Provider Notes (Cosign Needed)
ED Attendings       Lind Guest [facility] 06/01/20 15:39; Harle Battiest [facility] 06/01/20 18:31    History     Chief Complaint   Patient presents with    Flank Pain     Intermittent left sided flank pain x1 week. Right sided flank pain x 24 hours. Weakness/fatigue  x1 week. Increase in urination x4 days, dx with UTI. Prescribed macrobid. Hx kidney stones  with stenting and frequent UTI's. No pain meds taken today. Azo being taken at home       HPI  Patient is a 20 y.o. female h/o kidney stones s/p stent placement who presents with flank pain.  - last kidney stone oct 2020  - Macrobid 8/10  - 1wk ago dx'd UTI  - pain getting worse at night, peeing many times  - taking azo during day  - last took tylenol several days ago  - right now pain ok  - has period right now  - copper iud since 10/2019, heavy periods  - very light bleeding today, small dots of blood    Allergies/Contraindications   Allergen Reactions    Lactose                      Social History     Socioeconomic History    Marital status: Single       Review of Systems     Review of Systems   Constitutional: Negative for fever.   HENT: Negative for sinus pain.    Eyes: Negative for pain.   Respiratory: Negative for chest tightness.    Cardiovascular: Negative for chest pain.   Gastrointestinal: Negative for diarrhea.   Genitourinary: Positive for flank pain.   Musculoskeletal: Negative for neck pain.   Allergic/Immunologic: Negative for immunocompromised state.   Neurological: Negative for headaches.   Psychiatric/Behavioral: Negative for agitation.       Physical Exam   Triage Vital Signs:  BP: 128/80, Heart Rate: 80, Pulse - Palpated/Pleth: 87, Temp: 37.2 C (99 F), *Resp: 18, SpO2: 99 %    Physical Exam  Constitutional:       Appearance: She is well-developed.   HENT:      Head: Normocephalic and atraumatic.   Eyes:      General:         Right eye: No discharge.         Left eye: No discharge.   Cardiovascular:      Rate and Rhythm: Normal rate and regular rhythm.    Pulmonary:      Effort: Pulmonary effort is normal. No respiratory distress.   Abdominal:      General: Abdomen is flat. There is no distension.   Musculoskeletal:         General: No deformity.      Cervical back: Normal range of motion.   Skin:     General: Skin is warm and dry.   Neurological:      Mental Status: She is alert.      Comments: Moving all extremities purposefully.     Psychiatric:         Mood and Affect: Mood normal.         Behavior: Behavior normal.          Initial Assessment (problem list and differential diagnosis)     20 y.o. female with PMHx including nephrolithiasis s/p stent placement 10/2019, frequent UTIs presents with nighttime L flank pain ongoing for past week. Vitals  stable. On exam, patient is currently comfortable with no CVA tenderness.    Patient is clinically well appearing and currently comfortable. Given previous stone and endorsing similar symptoms most concerning for nephrolithiasis. Doubt pyelonephritis without fever, CVA tenderness. Ddx also includes musculoskeletal causes. No exam findings concerning for local abscess.    Plan:  - UA, U preg  - POCUS           Interpretations:  Lab, Imaging, and ECG     See ED course below.    Reassessment and Final Disposition       ED Course as of 06/01/20 1831   Yolande Jolly Theia Dezeeuw's Documentation   Mon Jun 01, 2020   1653 Patient with hydro on POCUS and indeterminate UA with blood however still having spotting during her period.   1830 POCT Preg Test, Ur: Negative   1831 Urinalysis with microscopy (R)(!)  Blood present. Unable to interpret due to patient currently menstruating.      Others' Documentation   Mon Jun 01, 2020   1539 I have discussed patient with pediatric/emergency medicine resident/fellow physician and have seen and examined patient myself. Please see resident note for full H&P. I agree with the resident/fellow's assessment with the addition of the following:  Pt is a 20 yo f w hx of UTI and prior renal nephrolithiasis  presenting on day 8/10 of macrobid for UTI dx last week- now w L sided flank pain.   Menses: just finishing up menstrual period   No vaginal discharge or pelvic pain  Bedside POCUS w mild hydro but no stone visualize  UA w large hemoglobin    Also taking Azo.   Low concern for pyelo based on UA. Possible nephrolithiasis.    [NG]   1656 Pt requesting To be sent to [facility] for further Eval- possible CT imaging. Discussed pt w Dr. Renaee Munda. Pt to be sent BLS.   Pain currently controlled.  [NG]      ED Course User Index  [NG] Nicolaus Jerry Caras, MD        Cornell Barman, MD  Resident  06/01/20 [phone]

## 2020-06-02 ENCOUNTER — Emergency Department: Admit: 2020-06-02 | Discharge: 2020-06-02 | Payer: BC Managed Care – PPO

## 2020-06-02 LAB — CHLAMYDIA TRACHOMATIS/NEISSERIA GONORRHOEAE RNA
CT RNA: NOT DETECTED
Comments: NOT DETECTED
GC RNA: NOT DETECTED

## 2020-06-02 LAB — BASIC METABOLIC PANEL (NA, K, CL, CO2, BUN, CR, GLU, CA)
Anion Gap: 14 (ref 4–14)
Calcium, total, Serum / Plasma: 10.3 mg/dL (ref 8.4–10.5)
Carbon Dioxide, Total: 23 mmol/L (ref 22–29)
Chloride, Serum / Plasma: 103 mmol/L (ref 101–110)
Creatinine: 0.72 mg/dL (ref 0.55–1.02)
Glucose, non-fasting: 88 mg/dL (ref 70–199)
Potassium, Serum / Plasma: 3.2 mmol/L — ABNORMAL LOW (ref 3.5–5.0)
Sodium, Serum / Plasma: 140 mmol/L (ref 135–145)
Urea Nitrogen, Serum / Plasma: 8 mg/dL (ref 7–25)
eGFRcr: 122 mL/min/{1.73_m2} (ref >59)

## 2020-06-02 LAB — COMPLETE BLOOD COUNT WITH DIFFERENTIAL
Abs Basophils: 0.03 10*9/L (ref 0.00–0.10)
Abs Eosinophils: 0.04 10*9/L (ref 0.00–0.40)
Abs Imm Granulocytes: 0.02 10*9/L (ref <0.10)
Abs Lymphocytes: 2.01 10*9/L (ref 1.00–3.40)
Abs Monocytes: 0.37 10*9/L (ref 0.20–0.80)
Abs Neutrophils: 5.01 10*9/L (ref 1.80–6.80)
Hematocrit: 45.8 % (ref 36.0–46.0)
Hemoglobin: 15.5 g/dL (ref 12.0–15.5)
MCH: 30.6 pg (ref 26.0–34.0)
MCHC: 33.8 g/dL (ref 31.0–36.0)
MCV: 91 fL (ref 80–100)
Platelet Count: 318 10*9/L (ref 140–450)
RBC Count: 5.06 10*12/L (ref 4.00–5.20)
WBC Count: 7.5 10*9/L (ref 3.4–10.0)

## 2020-06-02 LAB — POCT URINE PREGNANCY, < 18 YEARS
POCT Preg Test, Ur: NEGATIVE
Test Kit Lot Number: 1102060

## 2020-06-02 LAB — URINE CULTURE: Bacterial Culture, Urine w/o gram stain: 1000 — AB

## 2020-06-02 MED FILL — KETOROLAC 15 MG/ML INJECTION SOLUTION: 15 mg/mL | INTRAMUSCULAR | Qty: 1

## 2020-08-17 LAB — URINALYSIS WITH REFLEX TO CULTURE
Bilirubin, Urine: NEGATIVE
Bilirubin, Urine: NEGATIVE
Glucose, (UA): NEGATIVE mg/dL
Glucose, (UA): NEGATIVE mg/dL
Hemoglobin (UA): NEGATIVE
Hemoglobin (UA): NEGATIVE
Ketones, UA: NEGATIVE mg/dL
Ketones, UA: NEGATIVE mg/dL
Nitrite: NEGATIVE
Nitrite: NEGATIVE
Protein, UA: NEGATIVE mg/dL
Protein, UA: NEGATIVE mg/dL
Specific Gravity: 1.008 (ref 1.002–1.030)
Specific Gravity: 1.003 (ref 1.002–1.030)
Urobilinogen: NEGATIVE mg/dL(EU/dL)
Urobilinogen: NEGATIVE mg/dL(EU/dL)
WBC Esterase: NEGATIVE
WBC Esterase: NEGATIVE
pH, UA: 6 (ref 4.5–8.0)
pH, UA: 7 (ref 4.5–8.0)

## 2020-08-17 LAB — BASIC METABOLIC PANEL (NA, K, CL, CO2, BUN, CR, GLU, CA)
Anion Gap: 6 (ref 4–14)
Calcium, total, Serum / Plasma: 10 mg/dL (ref 8.4–10.5)
Carbon Dioxide, Total: 27 mmol/L (ref 22–29)
Chloride, Serum / Plasma: 105 mmol/L (ref 101–110)
Creatinine: 0.71 mg/dL (ref 0.55–1.02)
Glucose, non-fasting: 94 mg/dL (ref 70–199)
Potassium, Serum / Plasma: 4.1 mmol/L (ref 3.5–5.0)
Sodium, Serum / Plasma: 138 mmol/L (ref 135–145)
Urea Nitrogen, Serum / Plasma: 10 mg/dL (ref 7–25)
eGFRcr: 123 mL/min/{1.73_m2} (ref >59)

## 2020-08-17 LAB — HCG PREGNANCY, URINE, >= 18 YEARS: HCG Pregnancy, Urine, >= 18 years: NEGATIVE

## 2020-08-17 LAB — COMPLETE BLOOD COUNT WITH DIFFERENTIAL
Abs Basophils: 0.02 10*9/L (ref 0.00–0.10)
Abs Eosinophils: 0.14 10*9/L (ref 0.00–0.40)
Abs Imm Granulocytes: 0.02 10*9/L (ref <0.10)
Abs Lymphocytes: 1.69 10*9/L (ref 1.00–3.40)
Abs Monocytes: 0.39 10*9/L (ref 0.20–0.80)
Abs Neutrophils: 5.41 10*9/L (ref 1.80–6.80)
Hematocrit: 41.6 % (ref 36.0–46.0)
Hemoglobin: 14.1 g/dL (ref 12.0–15.5)
MCH: 30.9 pg (ref 26.0–34.0)
MCHC: 33.9 g/dL (ref 31.0–36.0)
MCV: 91 fL (ref 80–100)
Platelet Count: 266 10*9/L (ref 140–450)
RBC Count: 4.57 10*12/L (ref 4.00–5.20)
WBC Count: 7.7 10*9/L (ref 3.4–10.0)

## 2020-08-17 LAB — HCG PREGNANCY, SERUM, QUANTITATIVE >= 18 YEARS: HCG Pregnancy, Serum, >= 18 years: <1 IU/L (ref <5)

## 2020-08-17 MED ORDER — IBUPROFEN 600 MG TABLET
600 | Freq: Once | ORAL | Status: DC
Start: 2020-08-17 — End: 2020-08-17

## 2020-08-17 NOTE — ED Provider Notes (Signed)
ED Attendings          Colletta Maryland 08/17/20 13:44    History     Chief Complaint   Patient presents with    Flank Pain     Pt presents to ED with L flank pain x 1 week. Finished macrobid. H/O kidney stones with stent       HPI  20 yo f  Hx of kidney stone, was stented in past, hx of hydronephrosis  Now with 2 days of left flank pain, improved  -- fever -- chills + dysuria  Left flank pain is improved vs yesterday      Allergies/Contraindications   Allergen Reactions    Lactose                      Social History     Socioeconomic History    Marital status: Single       Review of Systems     Review of Systems   Constitutional: Negative.    HENT: Negative.    Respiratory: Negative.    Cardiovascular: Negative.    Gastrointestinal: Negative.  Negative for abdominal distention, abdominal pain, anal bleeding, blood in stool, constipation, diarrhea, nausea and rectal pain.   Genitourinary: Positive for difficulty urinating and flank pain. Negative for decreased urine volume, pelvic pain and vaginal bleeding.   Neurological: Negative.  Negative for dizziness.   Hematological: Negative.    Psychiatric/Behavioral: Negative.        Physical Exam   Triage Vital Signs:  BP: 126/83, Heart Rate: 86, Temp: 36.9 C (98.4 F), *Resp: 18    Physical Exam  Constitutional:       Appearance: Normal appearance.   HENT:      Head: Normocephalic.      Right Ear: Tympanic membrane normal.      Nose: Nose normal.      Mouth/Throat:      Mouth: Mucous membranes are moist.   Cardiovascular:      Rate and Rhythm: Normal rate and regular rhythm.      Pulses: Normal pulses.      Heart sounds: No murmur heard.    No friction rub.   Pulmonary:      Effort: Pulmonary effort is normal.      Breath sounds: Normal breath sounds.   Abdominal:      General: Abdomen is flat.   Musculoskeletal:         General: Normal range of motion.   Skin:     General: Skin is warm.      Capillary Refill: Capillary refill takes 2 to 3 seconds.   Neurological:       General: No focal deficit present.      Mental Status: She is alert and oriented to person, place, and time.   Psychiatric:         Mood and Affect: Mood normal.          Initial Assessment (problem list and differential diagnosis)   20 yo f  Left flank pain x 2 days, improved today  Concerned as she will need to travel soon  Labs wnl  UA neg for infection or hematuria  Will discuss need for CT with patient who is you and has had a number of CT scans          Interpretations:  Lab, Imaging, and ECG     I have reviewed the patient's labs.  All labs are within normal limits.  No new Radiology.       Reassessment and Final Disposition     ED Disposition: Data Unavailable  Clinical Impression(s):  flank pain    Flank pain, perhaps passed stone  Given lack of symptoms today and normal UA, labs, vitals - will defer repeat CT scan as it likley will not change management of patient. Rest, drink fluids, pain releif as needed, rtc prn worsening pain, fever, or other worrieome symptom     Glean Salvo, MD  08/17/20 1639

## 2020-08-17 NOTE — Unmapped (Addendum)
Return for worsening symptoms immediately

## 2020-08-20 ENCOUNTER — Emergency Department: Admit: 2020-08-20 | Discharge: 2020-08-20 | Payer: BC Managed Care – PPO

## 2020-08-20 DIAGNOSIS — N200 Calculus of kidney: Secondary | ICD-10-CM

## 2020-08-20 LAB — HCG PREGNANCY, SERUM, QUANTITATIVE >= 18 YEARS: HCG Pregnancy, Serum, >= 18 years: <1 IU/L (ref <5)

## 2020-08-20 LAB — URINALYSIS WITH REFLEX TO CULTURE
Bilirubin, Urine: NEGATIVE
Glucose, (UA): NEGATIVE mg/dL
Ketones, UA: NEGATIVE mg/dL
Nitrite: NEGATIVE
Protein, UA: NEGATIVE mg/dL
RBCs, urine: 3 /HPF (ref <3)
Round Epith Cells: 6 /LPF
Specific Gravity: 1.008 (ref 1.002–1.030)
Squam Epith Cells: >10 /LPF
Urobilinogen: NEGATIVE mg/dL(EU/dL)
WBC Esterase: NEGATIVE
WBCs, UR: 5 /HPF (ref <5)
pH, UA: 6.5 (ref 4.5–8.0)

## 2020-08-20 LAB — COMPREHENSIVE METABOLIC PANEL (BMP, AST, ALT, T.BILI, ALKP, TP ALB)
AST: 15 U/L (ref 5–44)
Alanine transaminase: 8 U/L — ABNORMAL LOW (ref 10–61)
Albumin, Serum / Plasma: 4.1 g/dL (ref 3.5–5.0)
Alkaline Phosphatase: 47 U/L (ref 38–108)
Anion Gap: 6 (ref 4–14)
Bilirubin, Total: 0.3 mg/dL (ref 0.2–1.2)
Calcium, total, Serum / Plasma: 9.3 mg/dL (ref 8.4–10.5)
Carbon Dioxide, Total: 25 mmol/L (ref 22–29)
Chloride, Serum / Plasma: 108 mmol/L (ref 101–110)
Creatinine: 0.69 mg/dL (ref 0.55–1.02)
Glucose, non-fasting: 90 mg/dL (ref 70–199)
Potassium, Serum / Plasma: 4 mmol/L (ref 3.5–5.0)
Protein, Total, Serum / Plasma: 7.4 g/dL (ref 6.3–8.6)
Sodium, Serum / Plasma: 139 mmol/L (ref 135–145)
Urea Nitrogen, Serum / Plasma: 12 mg/dL (ref 7–25)
eGFRcr: 126 mL/min/{1.73_m2} (ref >59)

## 2020-08-20 LAB — COMPLETE BLOOD COUNT WITH DIFFERENTIAL
Abs Basophils: 0.04 10*9/L (ref 0.00–0.10)
Abs Eosinophils: 0.06 10*9/L (ref 0.00–0.40)
Abs Imm Granulocytes: 0.03 10*9/L (ref <0.10)
Abs Lymphocytes: 1.52 10*9/L (ref 1.00–3.40)
Abs Monocytes: 0.38 10*9/L (ref 0.20–0.80)
Abs Neutrophils: 3.67 10*9/L (ref 1.80–6.80)
Hematocrit: 40.6 % (ref 36.0–46.0)
Hemoglobin: 13.2 g/dL (ref 12.0–15.5)
MCH: 29.9 pg (ref 26.0–34.0)
MCHC: 32.5 g/dL (ref 31.0–36.0)
MCV: 92 fL (ref 80–100)
Platelet Count: 261 10*9/L (ref 140–450)
RBC Count: 4.42 10*12/L (ref 4.00–5.20)
WBC Count: 5.7 10*9/L (ref 3.4–10.0)

## 2020-08-20 MED ORDER — TAMSULOSIN 0.4 MG CAPSULE
0.4 | ORAL_CAPSULE | Freq: Every day | ORAL | 0 refills | Status: AC
Start: 2020-08-20 — End: ?

## 2020-08-20 NOTE — ED Provider Notes (Cosign Needed)
ED Attendings          NIDA F DEGESYS 08/20/20 12:07    History     Chief Complaint   Patient presents with    Dysuria     "Feels like I'm peeing out glass". Seen here 3 days ago for flank pain which is now resolved.      This is an independent service.  The available consultant for this service is Nida Sherri Sear, MD.         HPI  20 y.o. female h/o kidney stones s/p stent placement in 10/2018 seen in this ED on 08/17/20 for L flank pain for 1 wk is s/o completing macrobid on 8/15 for UTI.  Had nml labs and flank pain resolved prior to ED visit, no imaging performed.  Pt presents today for ongoing L lower flank pain w/ rad to L groin region and dysuria w/ freq/urg, no hematuria, LMP 08/03/20.  No N/V, no fever/chills/rigors, no vaginal d/c.  UA bland 3d ago.      Allergies/Contraindications   Allergen Reactions    Lactose                      Social History     Socioeconomic History    Marital status: Single       Review of Systems     Review of Systems   Constitutional: Negative for chills, diaphoresis and fever.   Respiratory: Negative for shortness of breath.    Cardiovascular: Negative for chest pain.   Gastrointestinal: Negative for abdominal pain, diarrhea, nausea and vomiting.   Genitourinary: Positive for dysuria, flank pain, frequency and urgency. Negative for hematuria.   Skin: Negative for rash.   Neurological: Negative for dizziness and syncope.   All other systems reviewed and are negative.      Physical Exam   Triage Vital Signs:  BP: 123/76, Heart Rate: 79, Pulse - Palpated/Pleth: 79, Temp: 36.7 C (98.1 F), *Resp: 18, SpO2: 100 %    Physical Exam  Vitals and nursing note reviewed.   Constitutional:       General: She is not in acute distress.     Appearance: She is well-developed.   HENT:      Head: Normocephalic.   Eyes:      General: No scleral icterus.     Conjunctiva/sclera: Conjunctivae normal.   Cardiovascular:      Rate and Rhythm: Normal rate and regular rhythm.      Heart sounds:  Normal heart sounds. No murmur heard.    No friction rub. No gallop.   Pulmonary:      Effort: Pulmonary effort is normal. No respiratory distress.      Breath sounds: Normal breath sounds. No stridor. No wheezing or rales.   Chest:      Chest wall: No tenderness.   Abdominal:      General: Bowel sounds are normal. There is no distension.      Palpations: Abdomen is soft. Abdomen is not rigid. There is no mass.      Tenderness: There is no abdominal tenderness. There is no guarding or rebound. Negative signs include Murphy's sign and McBurney's sign.      Hernia: No hernia is present.   Skin:     General: Skin is warm and dry.   Neurological:      Mental Status: She is alert and oriented to person, place, and time.   Psychiatric:  Behavior: Behavior normal.         Thought Content: Thought content normal.         Judgment: Judgment normal.          Initial Assessment (problem list and differential diagnosis)     DDX: suspect UTI, Pyelo, nephrolithiasis, doubt appy/chole/TOA/PID.    Course:   Labs: cbc, cmp, hcg, UA/UCx   Imaging: Renal US          Interpretations:  Lab, Imaging, and ECG     I have reviewed the patient's labs.  Significant abnormals are hematuria, no pyuria.    I have reviewed the patient's Radiology report(s).  Significant abnormals are 51mm L interpole nephrolithiasis.       Reassessment and Final Disposition     ED Disposition: Data Unavailable  Clinical Impression(s):        ED Course as of 08/20/20 1447   Wyline [facility] Documentation   Thu Aug 20, 2020   1230 L flank/L groin pain, hematuria on UA, dysuria/freq/urg    Renal US w/ 31mm nonobs stone L interpole  Sx well controlled w/o intervention  On azo at home  Add flomax  D/c home  Follow up urology, ref placed  ED return prec given verbally and in writing      Johny Shears, NP  08/20/20 1628

## 2020-08-20 NOTE — Unmapped (Addendum)
You were seen and evaluated at [facility] ER for flank pain and urinary symptoms, you were diagnosed with a kidney stone based on labs and ultrasound.  It is small, a Nonobstructive 4 mm left renal stone.  Most kidney stones will pass on their own without medical intervention.  Please use the flomax at night to facilitate passing the stone (take immediately before bed), use ibuprofen 600mg  (3 tabs) every 6 hours as needed for pain.  Please strain your urine and take it to urology if you catch the stone for analysis.  Drink plenty of water over the next 2 weeks.  Please return to the ER for fever, severe pain without relief with the prescribed meds, vomiting w/o relief with the prescribed meds, and inability to pee despite the urge.  Otherwise please call urology to arrange followup in the next 1-2 weeks.

## 2020-09-08 ENCOUNTER — Ambulatory Visit: Admit: 2020-09-08 | Payer: PRIVATE HEALTH INSURANCE | Attending: Physician | Primary: Physician Assistant

## 2020-09-08 DIAGNOSIS — N2 Calculus of kidney: Secondary

## 2020-09-08 NOTE — H&P (Signed)
ReSKU New Patient Note  Date of visit: 08/20/2020  Referring Provider: Brunetta Genera*    History of Present Illness:   Vanessa Villegas is a 20 y.o. female who was seen in consultation in the [facility] clinic as a new patient for evaluation and possible treatment of urolithiasis. The most recent stone episode started on 06/01/2020 and has resolved. At that time, she was symptomatic.     The stone was diagnosed upon work up for urinary tract infections. The patient has had a CT abdomen/pelvis as part of their work up. Medical expulsive therapy was not attempted.    Stone Passage/Treament  -Outcome: the stone has not passed  -Treatment for this particular stone episode: no surgical intervention, emergency stent surgery in 2020    Stone History  -Prior stone: stones since age 40-20   -Number of previous stone episodes: 2  -Laterality: experienced stones on both sides, pain is mostly to the left  -Prior surgery: no procedure for stones, except for emergency stent surgery in 2020    Risk Factors  -History of GI/Bariatric surgery: no history of GI/bariatric surgery  -Urinary reconstruction: no previous history of urinary reconstruction  -Anatomic anomalies: no urinary tract anomalies  -Family history of stones: no family history of stones    This is a 20 year old woman with a right renal punctate upper pole stone. Patient experiened her first kidney stone at age 72, around August 2020. She went into the ER and was told it was a UTI. 3 days later, an emergency stent surgery was performed. She reports that she has been getting frequent UTIs recently. She  has been taking a supplement to prevent UTIs.     Review of Systems: a complete ROS was performed and personally reviewed.   It is notable for:   Review of Systems   Respiratory: Positive for shortness of breath.    Cardiovascular: Positive for orthopnea.   Gastrointestinal: Positive for constipation.   Genitourinary: Positive for dysuria, flank pain and urgency.    Musculoskeletal: Positive for back pain, falls, joint pain and myalgias.   Neurological: Positive for dizziness.   Endo/Heme/Allergies: Positive for polydipsia.   Psychiatric/Behavioral: The patient is nervous/anxious.        The patient's past medical, surgical, medication, allergy, family, and social histories were reviewed and noncontributory to this illness/condition except as noted below:    Past Medical History:  No past medical history on file.    Past Surgical History:  No past surgical history on file.    Medications:   Current Outpatient Medications   Medication Sig Dispense Refill    tamsulosin (FLOMAX) 0.4 mg 24 hr capsule Take 1 capsule (0.4 mg total) by mouth nightly at bedtime 10 capsule 0     No current facility-administered medications for this visit.       Allergies:   Allergies/Contraindications   Allergen Reactions    Lactose        Family History:  No family history on file.    Social History:  Social History     Socioeconomic History    Marital status: Single     Spouse name: Not on file    Number of children: Not on file    Years of education: Not on file    Highest education level: Not on file   Occupational History    Not on file   Tobacco Use    Smoking status: Not on file    Smokeless tobacco: Not  on file   Substance and Sexual Activity    Alcohol use: Not on file    Drug use: Not on file    Sexual activity: Not on file   Other Topics Concern    Not on file   Social History Narrative    Not on file     Social Determinants of Health     Financial Resource Strain: Not on file   Food Insecurity: Not on file   Transportation Needs: Not on file         Objective   During the physical examination today, the patients vital signs were  There were no vitals taken for this visit.  General: Alert, pleasant, in no acute distress  HEENT: Normocephalic, atraumatic  Musculoskeletal: neck exhibits normal range of motion  Cardiovascular: no visible edema at extremities  Chest: Breathing is  unlabored  Genitourinary: Genital exam -  deferred  Psychiatric: Mood and affect are normal, the patient is socially appropriate  Neurologic: Deferred        Lab Review: The following lab results were personally reviewed by me:  Lab Results   Component Value Date    WBC Count 5.7 08/20/2020    Hemoglobin 13.2 08/20/2020    Hematocrit 40.6 08/20/2020    MCV 92 08/20/2020    Platelet Count 261 08/20/2020     Lab Results   Component Value Date    Calcium, total, Serum / Plasma 9.3 08/20/2020     Lab Results   Component Value Date    Sodium, Serum / Plasma 139 08/20/2020    Potassium, Serum / Plasma 4.0 08/20/2020    Chloride, Serum / Plasma 108 08/20/2020    Carbon Dioxide, Total 25 08/20/2020     Lab Results   Component Value Date    Creatinine 0.69 08/20/2020         Imaging:  The following imaging was personally reviewed and the findings were discussed with the patient. This demonstrated:   The patient had a CT/abdomen and pelvis performed on 06/01/2020. These images were reviewed and interpreted by me personally today. This demonstrates a right punctate upper pole stone. There is no hydronephrosis of the right kidney and no hydronephrosis of the left kidney. There are no visualized renal masses.        Assessment/Plan   It was a pleasure seeing Vanessa Villegas in clinic today.     This is a 20 year old woman with a right renal punctate upper pole stone. We will plan for stone prevention and ongoing surveillance.    I discussed with the patient that kidney stones are a chronic condition that pose threat to bodily function.    I addressed urolithiasis with the patient today, which is a new problem for Vanessa Villegas. This issue is stable.     Based on todays visit, we will plan for imaging, 24 hour urine collection, metabolic stone blood work and dietary changes.    Plan  - metabolic evaluation  - 24hr urine  - renal ultrasound  - follow up in 4 months with Danelle Earthly  The patient was instructed to increase their fluid intake to reach a  goal of 1.5-2 liters of daily urine. Additionally, the patient should reduce their dietary sodium intake by reducing the use of the salt shaker, eating out less, and being aware of the sodium content of foods when shopping. The patient should also reduce their total animal protein intake during individual meals. It is better to have multiple  small portions of animal protein throughout the day than one large protein-rich meal.     Thank you for allowing Vanessa Villegas to participate in the care of this patient. Please do not hesitate to contact Vanessa Villegas should you have any questions or concerns.           I performed this evaluation using real-time telehealth tools, including a live video Zoom connection between my location and the patient's location. Prior to initiating, the patient consented to perform this evaluation using telehealth tools.          Problems Addressed    Level 2  Self-limited or minor problem    Level 3  2 or more self-limited or minor problems  1 stable chronic illness  1 acute, uncomplicated illness or injury    Level 4  1 or more chronic illness with exacerbation, progression, or side effects of treatment  2 or more stable chronic illnesses  1 undiagnosed new problem with uncertain prognosis  1 acute illness with systemic symptoms  1 acute complicated injury    Level 5  1 or more chronic illness with severe exacerbation, progression, or side effects of treatment  1 acute or chronic illness or injury that poses a threat to life or bodily function      Amount and/or Complexity    Level 2  Minimal or None    Level 3  Review of prior external notes from unique source : 1/2/3+  Review of the results from each unique test: 1/2/3+  Ordered of each unique test: 1/2/3+  or  Assessment requiring an independent historian    Level 4  Independent interpretation of tests   or  Discussion of management or test interpretation    Level 5  Independent interpretation of tests   and  Discussion of management or test  interpretation        Risk    Level 2  Minimal    Level 3  OTC drugs  Minor surgery with no identified risk factors    Level 4  Prescription drug management  Minor surgery with identified risk factors  Elective major surgery with no identified risk factors  Diagnosis or treatment significantly limited by social determinates of health    Level 5   Elective major surgery with identified risk factors  Emergency major surgery  Drug therapy requiring intensive monitoring for toxicity  Decision not to resuscitate or to de-escalate care because of poor prognosis  Decision regarding hospitalization

## 2020-09-08 NOTE — Patient Instructions (Addendum)
Plan  - metabolic evaluation  - 24hr urine  - renal ultrasound  - follow up in 4 months with [facility]  Increase fluid intake to reach a goal of 1.5-2 liters of daily urine.   Reduce your dietary sodium intake by reducing the use of the salt shaker, eating out less, and being aware of the sodium content of foods when shopping.   Please reduce your total animal protein intake during individual meals. It is better to have multiple small portions of animal protein throughout the day than one large protein-rich meal.       Imaging Study Instructions    You have imaging studies ordered and awaiting your completion.    If your study is ordered at [facility], to schedule your imaging study, call [phone]    If your study is ordered to be completed at an outside facility, you must coordinate the study with that facility. Contact the facility to confirm that you can complete the study ordered for you at that facility. If you need assistance, you can contact the [place] office of Urology [phone] and ask for [facility] Bowl  to ensure that your imaging order forms have arrived successfully to that facility.     Please send outside images to:    Attention [facility], [facility]  [address], [address]  [place], [place] [address]    Or Upload the file digitally once you have the disc through the following website:    [email or web address]    Laboratory study completion instructions    You have laboratory studies awaiting your completion. If these studies are ordered for [facility], they have been ordered electronically and can be performed at any [facility] facility laboratory. Some [facility] laboratories and their phone numbers are listed below. You can call to confirm that your blood and urine labs are ordered for you and awaiting your completion    [facility]  [address], [address] floor  [place], [place]  Normal operating hours: Weekdays 7:30AM - 6:30PM  Call [phone]  to confirm prior to your arrival      [facility]  [address] floor  [place], [place]  Normal operating hours: Weekdays 7:00AM - 5:30PM  Call [phone] to confirm prior to your arrival      [facility]  [address] floor  [place], [place]  Normal operating hours: Weekdays 7:00AM - 5:30PM  Call [phone] to confirm prior to your arrival        If you are not completing your labs at [facility], but have your lab studies ordered for [facility], we recommend going to:    [email or web address]    To find a [facility] center close to you. Please call this location prior to your arrival to confirm that your laboratory studies are in their system and awaiting your completion        If you are not completing your labs at [facility], but have your lab studies ordered for [facility], we recommend going to:    [email or web address]    To find a [facility] diagnostics center close to you. Please call this location prior to your arrival to confirm that your laboratory studies are in their system and awaiting your completion        If you plan to complete your labs at center that is not [facility], [facility], or [facility], please contact that facility prior to your arrival to  confirm that you can complete the labs ordered for you at that facility. If you need assistance, you can contact the [place] office of Urology at [phone] and ask for Reynolds Bowl  to ensure that your lab order forms have arrived successfully to that facility.    24 hour Urine Instructions    We would like you to complete a 24hr urine study through the website [email or web address]    Our office will provide you with the laboratory form to complete this study via mail in the next few weeks. Once you have the laboratory form, you can go online to [email or web address] to complete the study. Please allow 2-4 weeks for the from to arrive at your address via mail. If you are having trouble please  call [phone] to speak with Reynolds Bowl for assistance in completing this study.     Please reach out to the schedulers at [phone] to speak with them about scheduling your upcoming visit or leave a message with your name and MRN if you do not hear from them in 10 business days.

## 2020-11-16 ENCOUNTER — Inpatient Hospital Stay: Admit: 2020-11-16 | Payer: PRIVATE HEALTH INSURANCE | Primary: Physician Assistant

## 2020-11-16 DIAGNOSIS — N200 Calculus of kidney: Secondary | ICD-10-CM

## 2021-04-10 IMAGING — CT CT ABDOMEN AND PELVIS WITH CONTRAST
2 of 4 series · 16 of 46 positions shown, 18 images · IV contrast (omnipaque)
Comparison: None.

CLINICAL DATA: LEFT flank pain.  Pyelonephritis.

EXAM:
CT ABDOMEN AND PELVIS WITH CONTRAST
TECHNIQUE: Multidetector CT imaging of the abdomen and pelvis was performed
using the standard protocol following bolus administration of
intravenous contrast.
CONTRAST:  80mL OMNIPAQUE IOHEXOL 300 MG/ML  SOLN

[Series 3: a/p w/ 5mm · axial · 0.79mm/px · z∈[+828,+1243]mm · 13 of 91 slices shown, 15 images]
[im 4/91  soft-tissue]
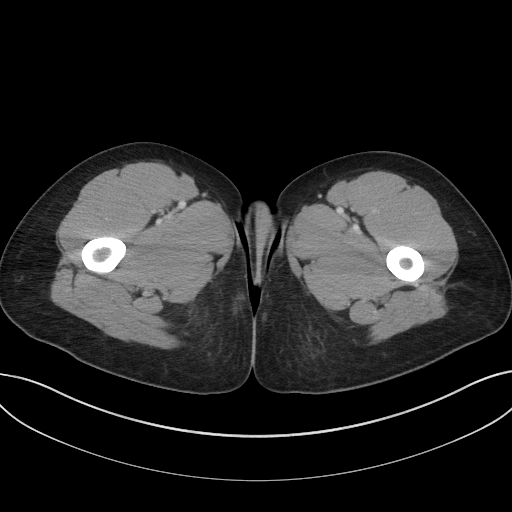
[im 4/91  bone]
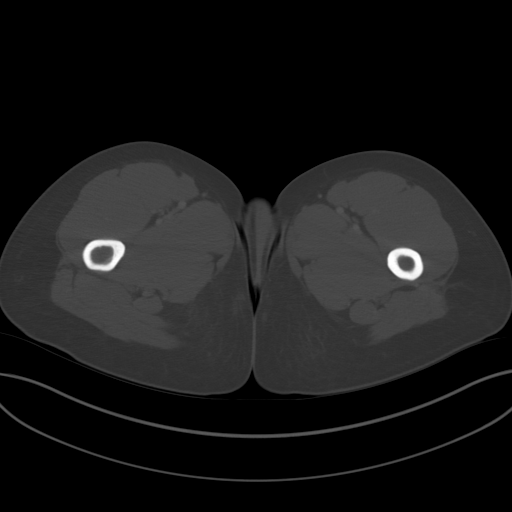
[im 11/91  soft-tissue]
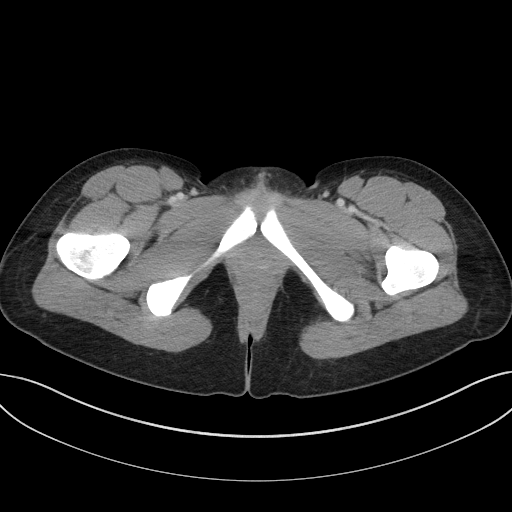
[im 19/91  soft-tissue]
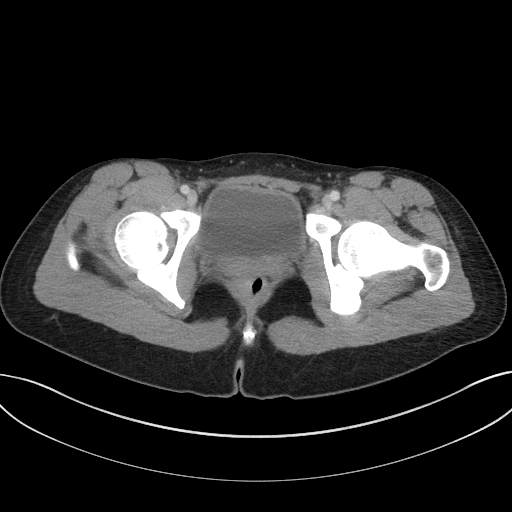
[im 26/91  soft-tissue]
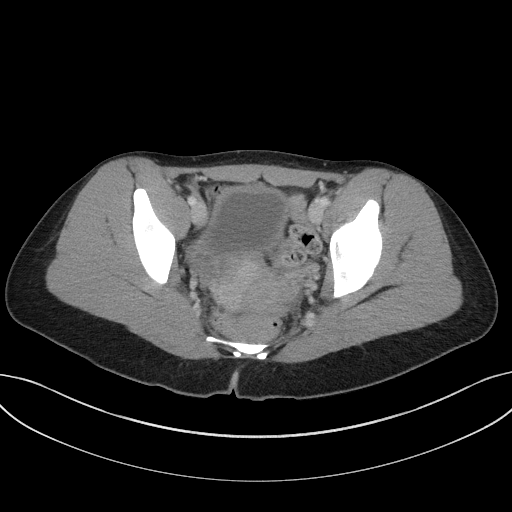
[im 33/91  soft-tissue]
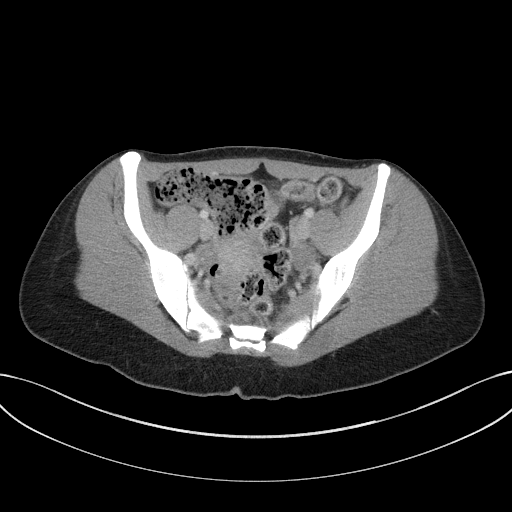
[im 40/91  soft-tissue]
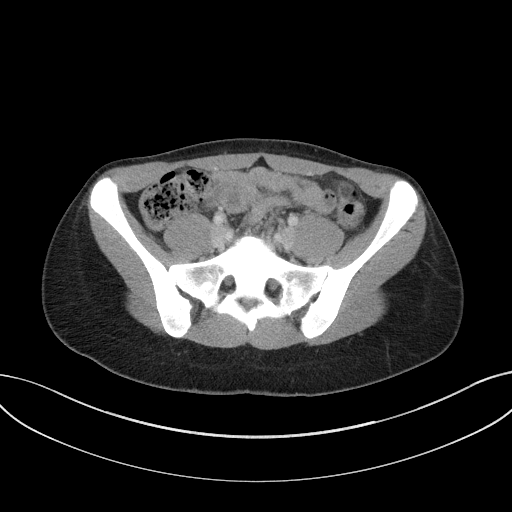
[im 47/91  soft-tissue]
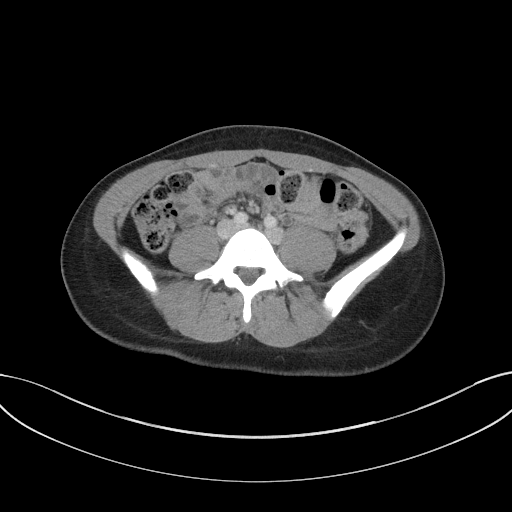
[im 51/91  soft-tissue]
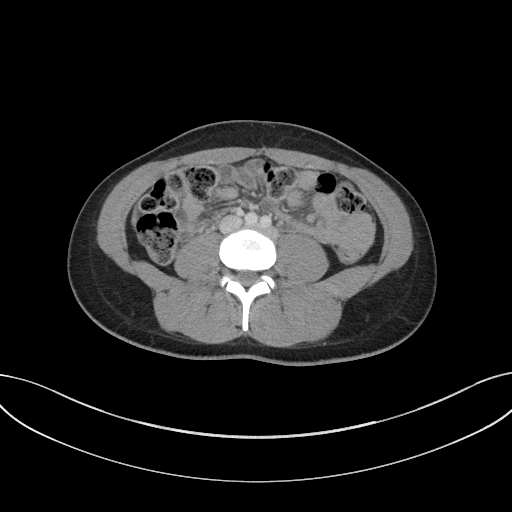
[im 58/91  soft-tissue]
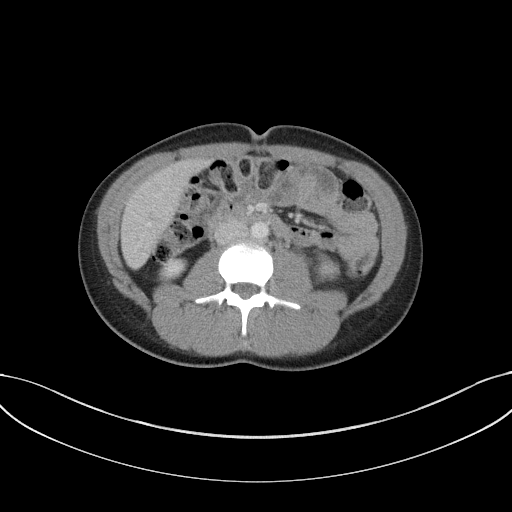
[im 58/91  bone]
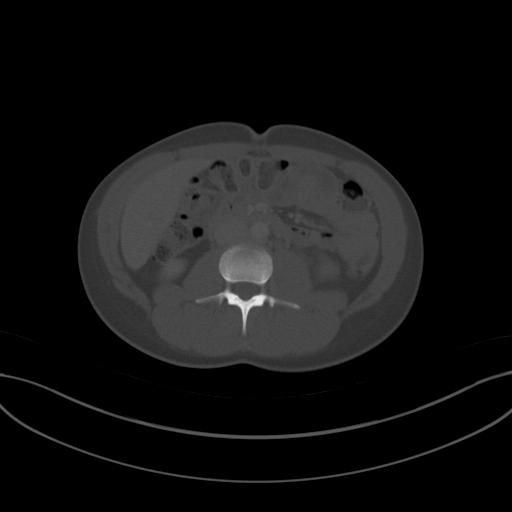
[im 65/91  soft-tissue]
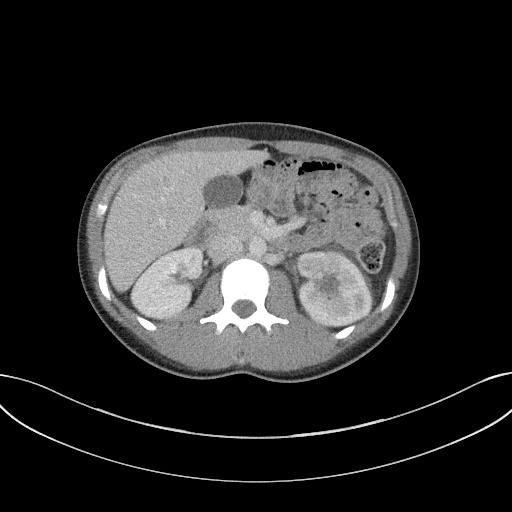
[im 73/91  soft-tissue]
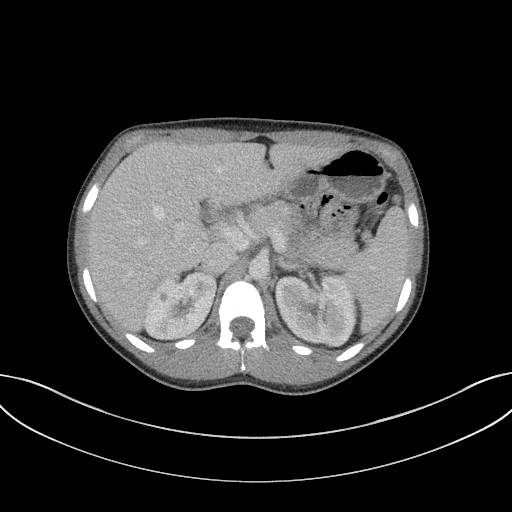
[im 80/91  soft-tissue]
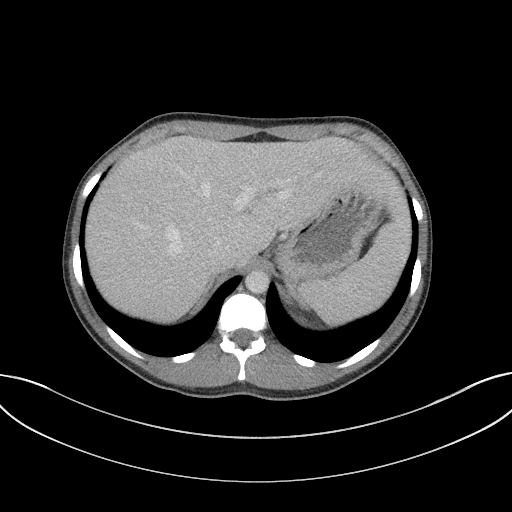
[im 87/91  soft-tissue]
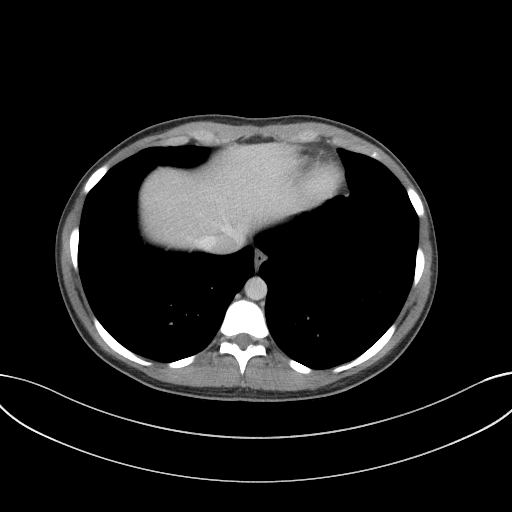

[Series 6: a/p w/ cor · coronal · 0.74mm/px · 3 of 123 slices shown]
[im 41/123  soft-tissue]
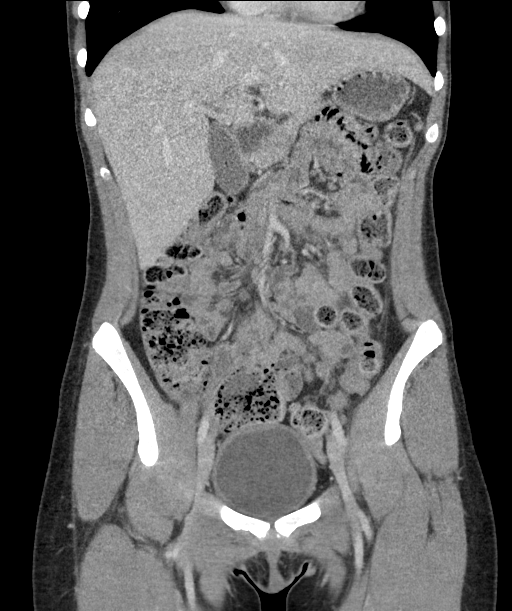
[im 55/123  soft-tissue]
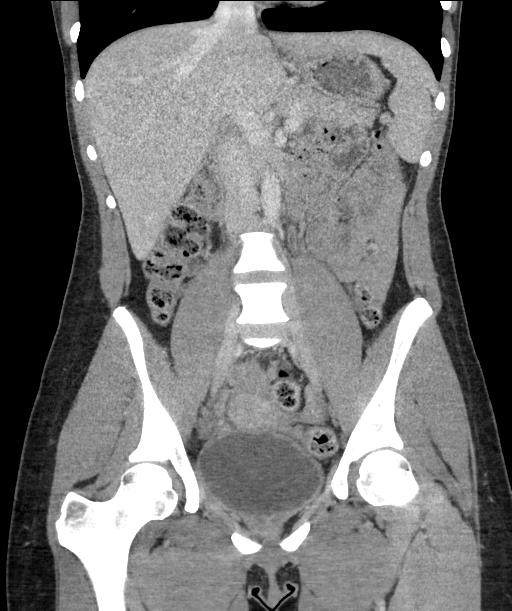
[im 68/123  soft-tissue]
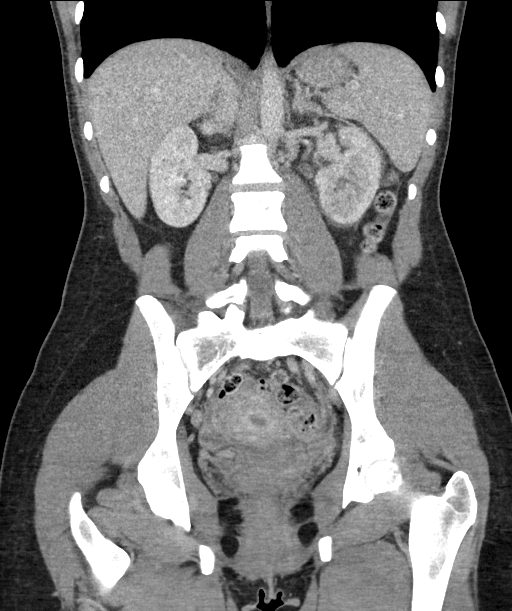

[16 of 46 positions shown; findings below may reference images not displayed]

FINDINGS: Lower chest: Lung bases are clear.

Hepatobiliary: No focal hepatic lesion. No biliary duct dilatation.
Gallbladder is normal. Common bile duct is normal.

Pancreas: Pancreas is normal. No ductal dilatation. No pancreatic
inflammation.

Spleen: Normal spleen

Adrenals/urinary tract: Adrenal glands are normal. There is mild
hypoperfusion of the LEFT kidney compared to the RIGHT kidney. Mild
renal edema on the LEFT. Mild delayed kidney active cysts and
hydroureter on the LEFT. There is a calculus in the deep LEFT pelvis
in the vicinity of the LEFT vesicoureteral junction measuring 3 mm
(image 71/3.)

RIGHT kidney is normal.  RIGHT ureter normal.  No bladder calculi.

Stomach/Bowel: Stomach, small-bowel and cecum are normal. The
appendix is not identified but there is no pericecal inflammation to
suggest appendicitis. The colon and rectosigmoid colon are normal.

Vascular/Lymphatic: Abdominal aorta is normal caliber. No periportal
or retroperitoneal adenopathy. No pelvic adenopathy.

Reproductive: Ovaries and uterus normal.

Other: No free fluid.

Musculoskeletal: No aggressive osseous lesion.
IMPRESSION: 1. Mild renal edema on the LEFT kidney with mild hydroureter.
Concern for obstructing calculus at the LEFT vesicoureteral
junction.
2. Cannot exclude superimposed infection of the LEFT kidney.
3. RIGHT kidney normal.
